# Patient Record
Sex: Female | Born: 1965
Health system: Southern US, Community
[De-identification: ages and names within clinical notes are randomized; demographics above are authoritative.]

## PROBLEM LIST (undated history)

## (undated) DIAGNOSIS — I10 Essential (primary) hypertension: Secondary | ICD-10-CM

## (undated) DIAGNOSIS — E785 Hyperlipidemia, unspecified: Secondary | ICD-10-CM

## (undated) HISTORY — DX: Hyperlipidemia, unspecified: E78.5

## (undated) HISTORY — DX: Essential (primary) hypertension: I10

---

## 2004-03-09 ENCOUNTER — Ambulatory Visit: Payer: Self-pay | Admitting: Internal Medicine

## 2010-05-12 ENCOUNTER — Ambulatory Visit: Payer: Self-pay | Admitting: Internal Medicine

## 2011-05-18 ENCOUNTER — Ambulatory Visit: Payer: Self-pay | Admitting: Internal Medicine

## 2011-06-04 ENCOUNTER — Ambulatory Visit: Payer: Self-pay | Admitting: Obstetrics and Gynecology

## 2011-07-28 ENCOUNTER — Ambulatory Visit: Payer: Self-pay | Admitting: Gastroenterology

## 2011-07-28 LAB — HM COLONOSCOPY

## 2013-01-11 IMAGING — US TRANSABDOMINAL ULTRASOUND OF PELVIS
1 series · 17 of 25 positions shown · non-contrast
Comparison: none

REASON FOR EXAM: post menopausal bleeding
COMMENTS:

[Series 1: transabdominal ultrasound of pelvis · 17 of 106 slices shown]
[im 1/106]
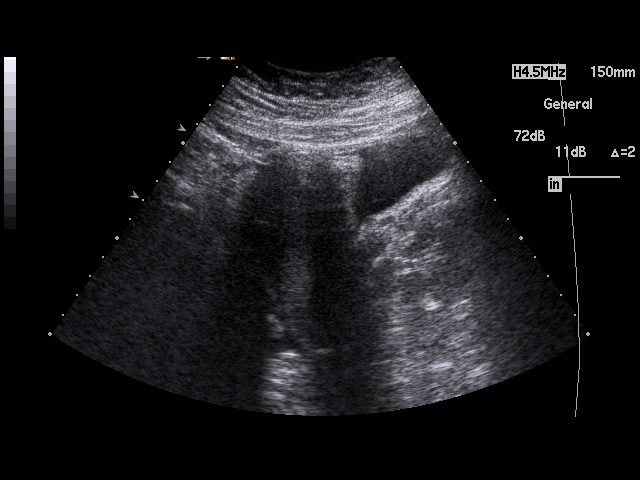
[im 9/106]
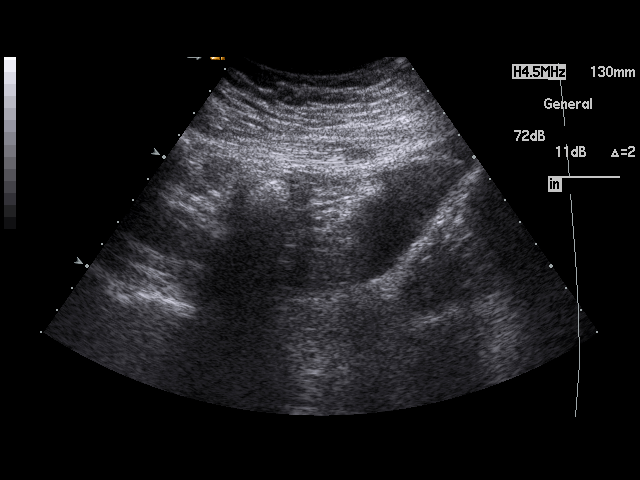
[im 14/106]
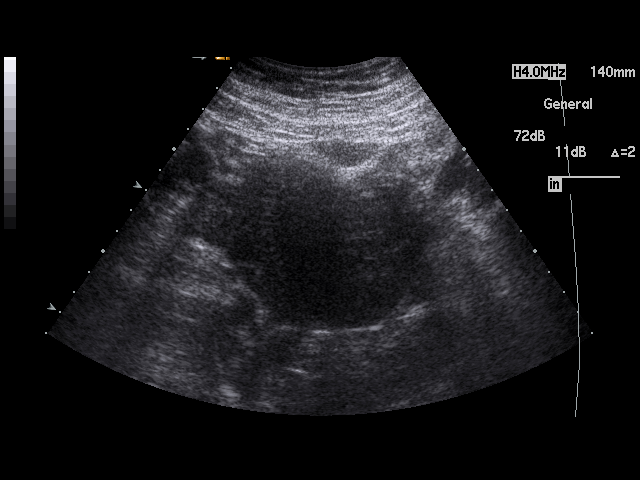
[im 22/106]
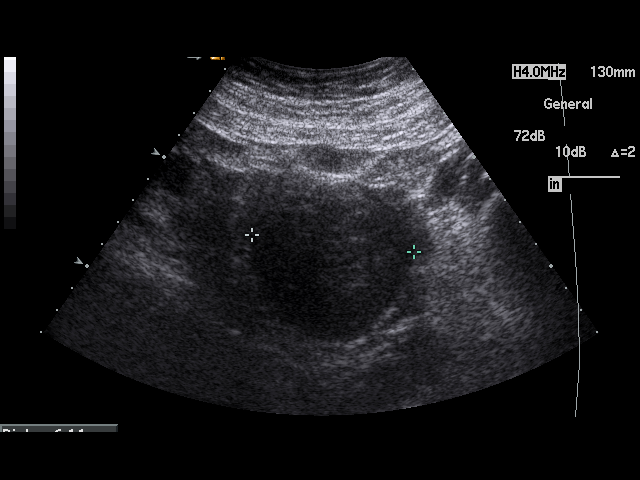
[im 27/106]
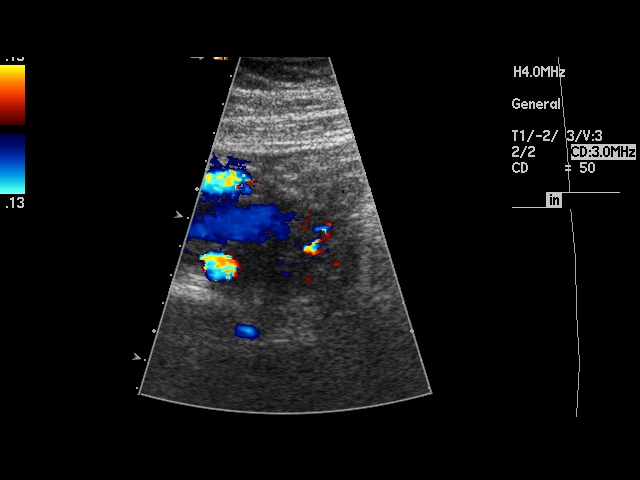
[im 36/106]
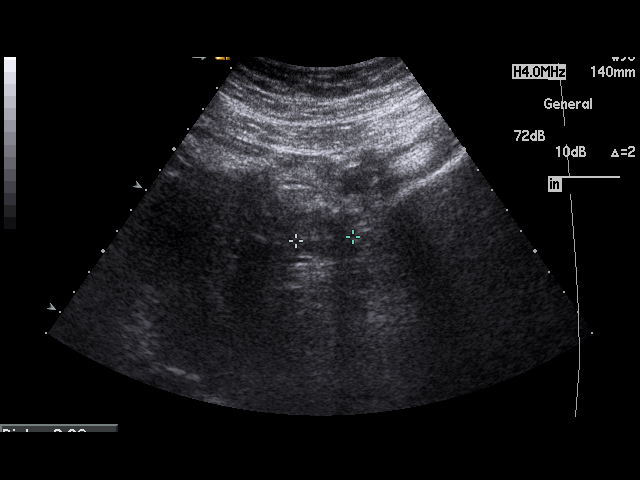
[im 40/106]
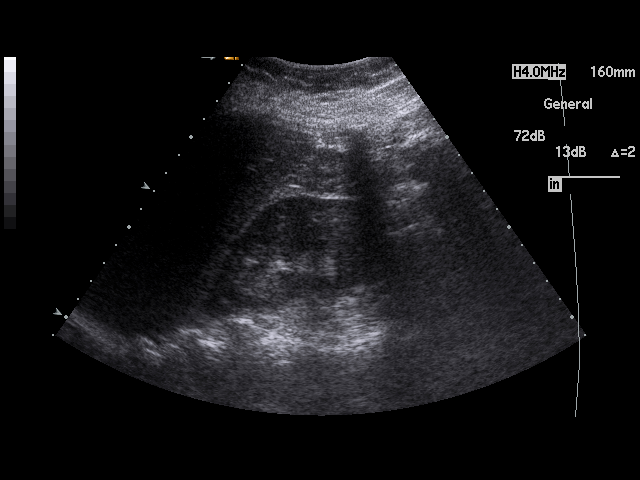
[im 49/106]
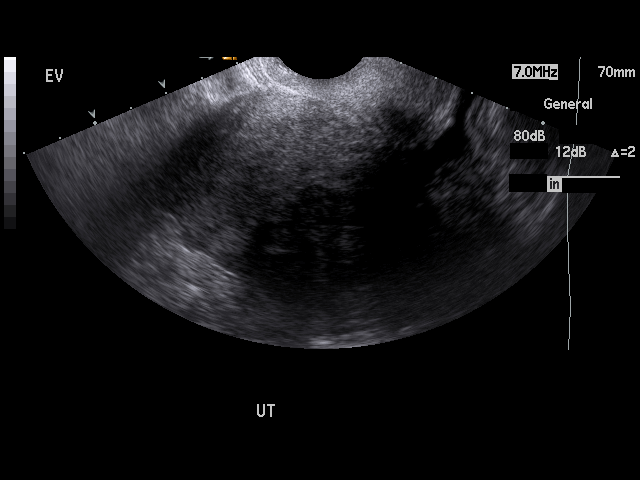
[im 53/106]
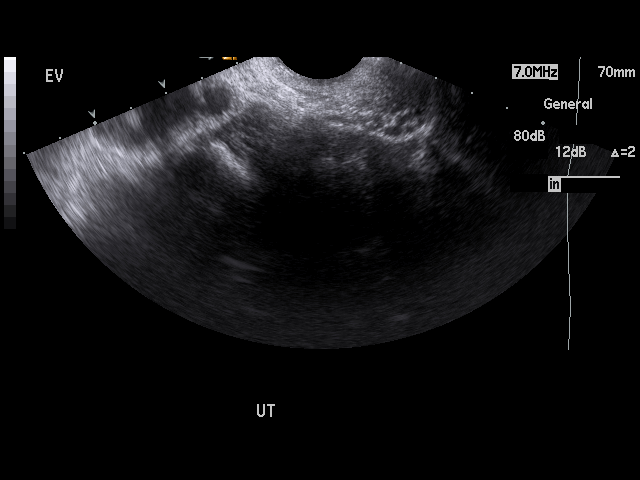
[im 57/106]
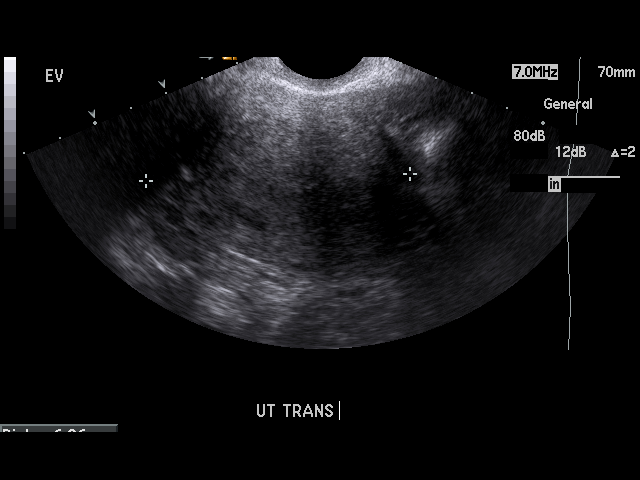
[im 66/106]
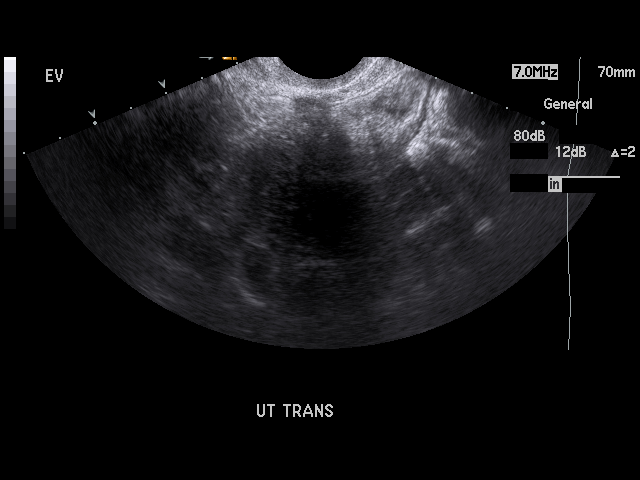
[im 71/106]
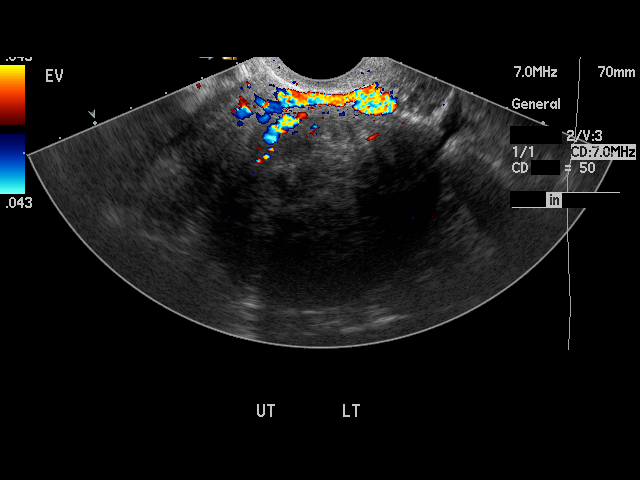
[im 79/106]
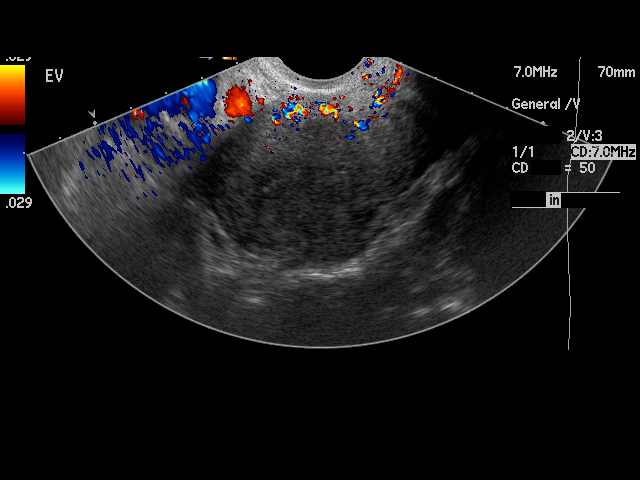
[im 84/106]
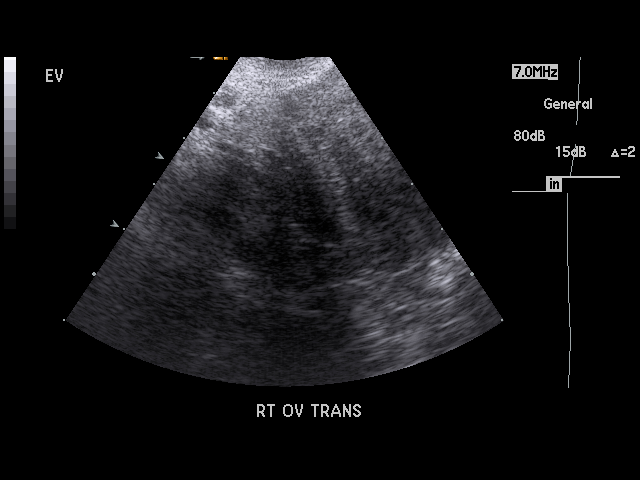
[im 92/106]
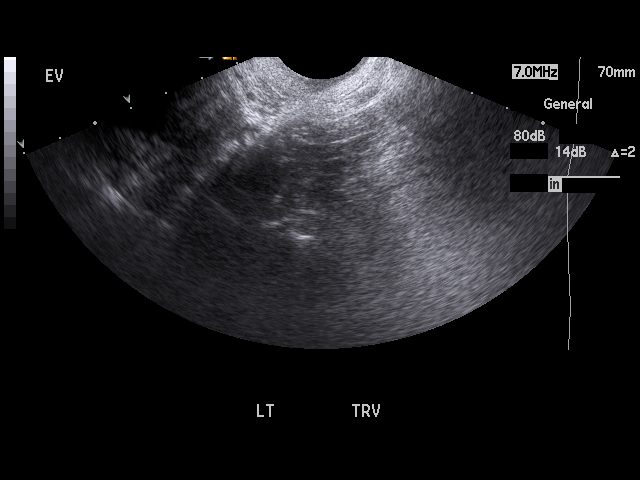
[im 97/106]
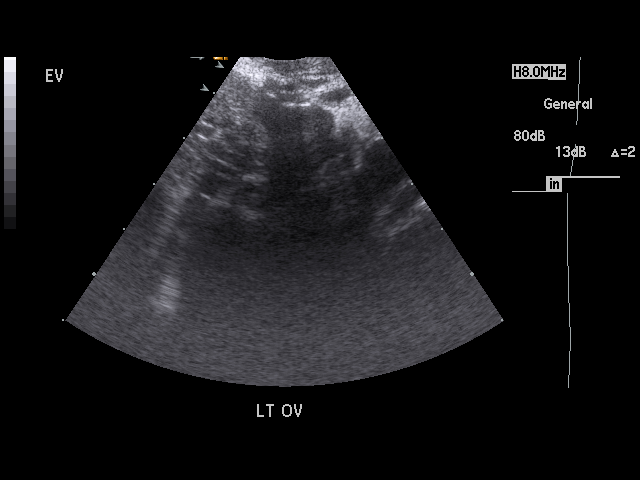
[im 106/106]
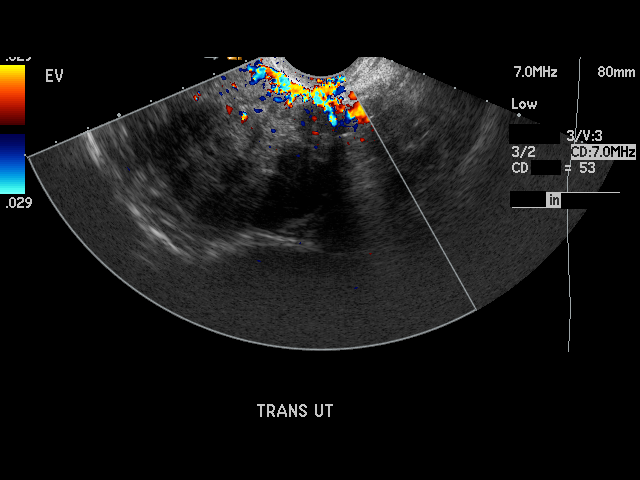

[17 of 25 positions shown; findings below may reference images not displayed]

PROCEDURE:     [facility] - [facility] PELVIS NON-OB W/TRANSVAGINAL  - June 04, 2011  [DATE]

RESULT:     Transabdominal and endovaginal ultrasound was performed. The
uterus measures 8.23 cm x 4.23 cm x 6.86 cm. There is a mass associated with
the posterior aspect of the uterus near the junction of the corpus and
fundus on the left side. The finding is compatible with a uterine fibroid
that measures 6.71 cm x 4.94 cm x 5.33 cm. No other uterine masses are seen.
The right and left ovaries are visualized and show no significant
abnormalities. The right ovary measures 2.16 cm at maximum diameter and the
left ovary measures 2.6 cm at maximum diameter. No abnormal adnexal masses
are seen. There is a nonspecific small amount of free fluid observed in the
cul-de-sac. The urinary bladder is not well filled and is poorly visualized
on this exam. The kidneys are visualized bilaterally and show no
hydronephrosis.
IMPRESSION: 1. There is a 6.71 cm mass associated with the posterior aspect of the
uterus and consistent with a uterine fibroid.
2. No additional uterine masses are seen.
3. No abnormal adnexal masses are identified.
4. There is a small amount of free fluid noted in the cul-de-sac.
5. The kidneys show no hydronephrosis.
6. The uterine endometrium measures 3.7 mm in thickness.

## 2014-02-13 ENCOUNTER — Ambulatory Visit: Payer: Self-pay | Admitting: Family Medicine

## 2015-07-29 ENCOUNTER — Other Ambulatory Visit: Payer: Self-pay | Admitting: Family Medicine

## 2015-07-29 DIAGNOSIS — Z1231 Encounter for screening mammogram for malignant neoplasm of breast: Secondary | ICD-10-CM

## 2015-08-15 ENCOUNTER — Other Ambulatory Visit: Payer: Self-pay | Admitting: Family Medicine

## 2015-08-15 ENCOUNTER — Ambulatory Visit
Admission: RE | Admit: 2015-08-15 | Discharge: 2015-08-15 | Disposition: A | Discharge status: Home / Self Care | Payer: BLUE CROSS/BLUE SHIELD | Source: Ambulatory Visit | Attending: Family Medicine | Admitting: Family Medicine

## 2015-08-15 DIAGNOSIS — Z1231 Encounter for screening mammogram for malignant neoplasm of breast: Secondary | ICD-10-CM

## 2016-06-03 ENCOUNTER — Other Ambulatory Visit: Payer: Self-pay | Admitting: Obstetrics and Gynecology

## 2016-06-03 DIAGNOSIS — Z1231 Encounter for screening mammogram for malignant neoplasm of breast: Secondary | ICD-10-CM

## 2016-08-16 ENCOUNTER — Ambulatory Visit
Admission: RE | Admit: 2016-08-16 | Discharge: 2016-08-16 | Disposition: A | Discharge status: Home / Self Care | Payer: BLUE CROSS/BLUE SHIELD | Source: Ambulatory Visit | Attending: Obstetrics and Gynecology | Admitting: Obstetrics and Gynecology

## 2016-08-16 DIAGNOSIS — Z1231 Encounter for screening mammogram for malignant neoplasm of breast: Secondary | ICD-10-CM | POA: Insufficient documentation

## 2017-04-22 DIAGNOSIS — R5383 Other fatigue: Secondary | ICD-10-CM | POA: Diagnosis not present

## 2017-04-22 DIAGNOSIS — R7989 Other specified abnormal findings of blood chemistry: Secondary | ICD-10-CM | POA: Diagnosis not present

## 2017-04-22 DIAGNOSIS — E7800 Pure hypercholesterolemia, unspecified: Secondary | ICD-10-CM | POA: Diagnosis not present

## 2017-04-22 DIAGNOSIS — Z0001 Encounter for general adult medical examination with abnormal findings: Secondary | ICD-10-CM | POA: Diagnosis not present

## 2017-04-22 DIAGNOSIS — E785 Hyperlipidemia, unspecified: Secondary | ICD-10-CM | POA: Diagnosis not present

## 2017-04-22 DIAGNOSIS — Z139 Encounter for screening, unspecified: Secondary | ICD-10-CM | POA: Diagnosis not present

## 2017-04-22 DIAGNOSIS — E559 Vitamin D deficiency, unspecified: Secondary | ICD-10-CM | POA: Diagnosis not present

## 2017-04-22 DIAGNOSIS — Z0000 Encounter for general adult medical examination without abnormal findings: Secondary | ICD-10-CM | POA: Diagnosis not present

## 2017-04-22 DIAGNOSIS — R5382 Chronic fatigue, unspecified: Secondary | ICD-10-CM | POA: Diagnosis not present

## 2017-07-19 ENCOUNTER — Other Ambulatory Visit: Payer: Self-pay | Admitting: Family Medicine

## 2017-07-19 DIAGNOSIS — Z1231 Encounter for screening mammogram for malignant neoplasm of breast: Secondary | ICD-10-CM

## 2017-08-05 ENCOUNTER — Ambulatory Visit
Admission: RE | Admit: 2017-08-05 | Discharge: 2017-08-05 | Disposition: A | Discharge status: Home / Self Care | Payer: BLUE CROSS/BLUE SHIELD | Source: Ambulatory Visit | Attending: Family Medicine | Admitting: Family Medicine

## 2017-08-05 DIAGNOSIS — Z1231 Encounter for screening mammogram for malignant neoplasm of breast: Secondary | ICD-10-CM | POA: Diagnosis not present

## 2018-04-25 ENCOUNTER — Ambulatory Visit: Payer: Self-pay | Admitting: Family Medicine

## 2018-06-05 ENCOUNTER — Other Ambulatory Visit: Payer: Self-pay

## 2018-06-05 ENCOUNTER — Ambulatory Visit: Payer: BLUE CROSS/BLUE SHIELD | Admitting: Family Medicine

## 2018-06-05 ENCOUNTER — Encounter: Payer: Self-pay | Admitting: Family Medicine

## 2018-06-05 VITALS — BP 122/76 | HR 64 | Ht 65.0 in | Wt 188.0 lb

## 2018-06-05 DIAGNOSIS — Z8639 Personal history of other endocrine, nutritional and metabolic disease: Secondary | ICD-10-CM | POA: Diagnosis not present

## 2018-06-05 DIAGNOSIS — E663 Overweight: Secondary | ICD-10-CM

## 2018-06-05 DIAGNOSIS — Z7689 Persons encountering health services in other specified circumstances: Secondary | ICD-10-CM | POA: Diagnosis not present

## 2018-06-05 NOTE — Patient Instructions (Signed)

## 2018-06-05 NOTE — Progress Notes (Signed)
Date:  06/05/2018   Name:  Mindy Yu   DOB:  August 27, 1965   MRN:  [id number]   Chief Complaint: Establish Care (in network) and Hyperlipidemia (history of hyperlipidemia- 264)  Patient is a 53 year old female who presents for an establish care exam. The patient reports the following problems: ? hyperlipidemia. Health maintenance has been reviewed up to date.  Hyperlipidemia  This is a chronic problem. The current episode started more than 1 year ago. The problem is uncontrolled. Recent lipid tests were reviewed and are high. She has no history of chronic renal disease, diabetes, hypothyroidism, liver disease, obesity or nephrotic syndrome. There are no known factors aggravating her hyperlipidemia. Pertinent negatives include no chest pain, focal sensory loss, focal weakness, leg pain, myalgias or shortness of breath. Current antihyperlipidemic treatment includes diet change. There are no compliance problems.  Risk factors for coronary artery disease include dyslipidemia.    Review of Systems  Constitutional: Negative.  Negative for chills, fatigue, fever and unexpected weight change.  HENT: Negative for congestion, ear discharge, ear pain, rhinorrhea, sinus pressure, sneezing and sore throat.   Eyes: Negative for photophobia, pain, discharge, redness and itching.  Respiratory: Negative for cough, shortness of breath, wheezing and stridor.   Cardiovascular: Negative for chest pain.  Gastrointestinal: Negative for abdominal pain, blood in stool, constipation, diarrhea, nausea and vomiting.  Endocrine: Negative for cold intolerance, heat intolerance, polydipsia, polyphagia and polyuria.  Genitourinary: Negative for dysuria, flank pain, frequency, hematuria, menstrual problem, pelvic pain, urgency, vaginal bleeding and vaginal discharge.  Musculoskeletal: Negative for arthralgias, back pain and myalgias.  Skin: Negative for rash.  Allergic/Immunologic: Negative for environmental allergies  and food allergies.  Neurological: Negative for dizziness, focal weakness, weakness, light-headedness, numbness and headaches.  Hematological: Negative for adenopathy. Does not bruise/bleed easily.  Psychiatric/Behavioral: Negative for dysphoric mood. The patient is not nervous/anxious.     There are no active problems to display for this patient.   No Known Allergies  History reviewed. No pertinent surgical history.  Social History   Tobacco Use   Smoking status: Former Smoker    Types: Cigarettes    Last attempt to quit: 02/01/2009    Years since quitting: 9.3   Smokeless tobacco: Never Used  Substance Use Topics   Alcohol use: Yes    Alcohol/week: 1.0 standard drinks    Types: 1 Glasses of wine per week    Comment: nightly   Drug use: Never     Medication list has been reviewed and updated.  No outpatient medications have been marked as taking for the 06/05/18 encounter (Office Visit) with Duanne Limerick, MD.    [facility] 2/9 Scores 06/05/2018  PHQ - 2 Score 1  PHQ- 9 Score 2    BP Readings from Last 3 Encounters:  06/05/18 122/76    Physical Exam Vitals signs and nursing note reviewed.  Constitutional:      General: She is not in acute distress.    Appearance: She is not diaphoretic.  HENT:     Head: Normocephalic and atraumatic.     Right Ear: Tympanic membrane, ear canal and external ear normal.     Left Ear: Tympanic membrane, ear canal and external ear normal.     Nose: Nose normal. No congestion or rhinorrhea.     Mouth/Throat:     Mouth: Mucous membranes are moist.  Eyes:     General:        Right eye: No discharge.  Left eye: No discharge.     Conjunctiva/sclera: Conjunctivae normal.     Pupils: Pupils are equal, round, and reactive to light.  Neck:     Musculoskeletal: Normal range of motion and neck supple.     Thyroid: No thyromegaly.     Vascular: No JVD.  Cardiovascular:     Rate and Rhythm: Regular rhythm. Bradycardia present.      Pulses: Normal pulses.     Heart sounds: Normal heart sounds. No murmur. No friction rub. No gallop.   Pulmonary:     Effort: Pulmonary effort is normal.     Breath sounds: Normal breath sounds. No wheezing or rhonchi.  Abdominal:     General: Bowel sounds are normal.     Palpations: Abdomen is soft. There is no mass.     Tenderness: There is no abdominal tenderness. There is no guarding or rebound.  Musculoskeletal: Normal range of motion.  Lymphadenopathy:     Cervical: No cervical adenopathy.  Skin:    General: Skin is warm and dry.     Capillary Refill: Capillary refill takes less than 2 seconds.  Neurological:     Mental Status: She is alert.     Deep Tendon Reflexes: Reflexes are normal and symmetric.     Wt Readings from Last 3 Encounters:  06/05/18 188 lb (85.3 kg)    BP 122/76    Pulse 64    Ht 5\' 5"  (1.651 m)    Wt 188 lb (85.3 kg)    BMI 31.28 kg/m   Assessment and Plan: 1. Establishing care with new doctor, encounter for Patient establishes care with new physician today Patient's previous records were not available for review.  Patient's history was only thing that we could review and there was no new formation. 2. Overweight for height And is overweight for height and loss diet was given. - Renal Function Panel  3. History of hyperlipidemia Patient has a history of hyperlipidemia for which she would not like to be on a medication as previously suggested.  Will check lipid panel and renal panel for evaluation. - Lipid panel - Renal Function Panel Immunizations are reviewed and recommendations provided.   Age appropriate screening tests are discussed. Counseling given for risk factor reduction interventions.  Immunizations was reviewed screening by Pap smear was reviewed with patient.  Patient apparently has a history of an abnormal Pap that we will have her sign for release of information so I can review past test to see if we need to refer to gynecology.

## 2018-06-06 LAB — LIPID PANEL
Chol/HDL Ratio: 2.5 ratio (ref 0.0–4.4)
Cholesterol, Total: 220 mg/dL — ABNORMAL HIGH (ref 100–199)
HDL: 89 mg/dL (ref >39)
LDL Calculated: 122 mg/dL — ABNORMAL HIGH (ref 0–99)
Triglycerides: 44 mg/dL (ref 0–149)
VLDL Cholesterol Cal: 9 mg/dL (ref 5–40)

## 2018-06-06 LAB — RENAL FUNCTION PANEL
Albumin: 4.7 g/dL (ref 3.8–4.9)
BUN/Creatinine Ratio: 13 (ref 9–23)
BUN: 10 mg/dL (ref 6–24)
CO2: 23 mmol/L (ref 20–29)
Calcium: 9.7 mg/dL (ref 8.7–10.2)
Chloride: 102 mmol/L (ref 96–106)
Creatinine, Ser: 0.78 mg/dL (ref 0.57–1.00)
GFR calc Af Amer: 100 mL/min/{1.73_m2} (ref >59)
GFR calc non Af Amer: 86 mL/min/{1.73_m2} (ref >59)
Glucose: 80 mg/dL (ref 65–99)
Phosphorus: 4.4 mg/dL — ABNORMAL HIGH (ref 3.0–4.3)
Potassium: 4.3 mmol/L (ref 3.5–5.2)
Sodium: 140 mmol/L (ref 134–144)

## 2018-07-10 ENCOUNTER — Other Ambulatory Visit: Payer: Self-pay | Admitting: Family Medicine

## 2018-07-10 DIAGNOSIS — Z1231 Encounter for screening mammogram for malignant neoplasm of breast: Secondary | ICD-10-CM

## 2018-08-17 ENCOUNTER — Other Ambulatory Visit: Payer: Self-pay

## 2018-08-17 ENCOUNTER — Ambulatory Visit
Admission: RE | Admit: 2018-08-17 | Discharge: 2018-08-17 | Disposition: A | Discharge status: Home / Self Care | Payer: BC Managed Care – PPO | Source: Ambulatory Visit | Attending: Family Medicine | Admitting: Family Medicine

## 2018-08-17 DIAGNOSIS — Z1231 Encounter for screening mammogram for malignant neoplasm of breast: Secondary | ICD-10-CM | POA: Diagnosis not present

## 2018-12-06 ENCOUNTER — Encounter: Payer: Self-pay | Admitting: Family Medicine

## 2018-12-06 ENCOUNTER — Ambulatory Visit: Payer: BC Managed Care – PPO | Admitting: Family Medicine

## 2018-12-06 ENCOUNTER — Other Ambulatory Visit: Payer: Self-pay

## 2018-12-06 VITALS — BP 120/80 | HR 60 | Ht 65.0 in | Wt 196.0 lb

## 2018-12-06 DIAGNOSIS — E663 Overweight: Secondary | ICD-10-CM | POA: Diagnosis not present

## 2018-12-06 DIAGNOSIS — Z8639 Personal history of other endocrine, nutritional and metabolic disease: Secondary | ICD-10-CM

## 2018-12-06 NOTE — Patient Instructions (Signed)

## 2018-12-06 NOTE — Progress Notes (Signed)
Date:  12/06/2018   Name:  Mindy Yu   DOB:  August 17, 1965   MRN:  [id number]   Chief Complaint: follow up visit (lipid recheck)  Hyperlipidemia This is a new problem. She has no history of chronic renal disease, diabetes, hypothyroidism, liver disease, obesity or nephrotic syndrome. Exacerbated by: stress about covid. Pertinent negatives include no chest pain, focal sensory loss, focal weakness, leg pain, myalgias or shortness of breath. Current antihyperlipidemic treatment includes diet change.    Review of Systems  Constitutional: Negative.  Negative for chills, fatigue, fever and unexpected weight change.  HENT: Negative for congestion, ear discharge, ear pain, rhinorrhea, sinus pressure, sneezing and sore throat.   Eyes: Negative for photophobia, pain, discharge, redness and itching.  Respiratory: Negative for cough, shortness of breath, wheezing and stridor.   Cardiovascular: Negative for chest pain.  Gastrointestinal: Negative for abdominal pain, blood in stool, constipation, diarrhea, nausea and vomiting.  Endocrine: Negative for cold intolerance, heat intolerance, polydipsia, polyphagia and polyuria.  Genitourinary: Negative for dysuria, flank pain, frequency, hematuria, menstrual problem, pelvic pain, urgency, vaginal bleeding and vaginal discharge.  Musculoskeletal: Negative for arthralgias, back pain and myalgias.  Skin: Negative for rash.  Allergic/Immunologic: Negative for environmental allergies and food allergies.  Neurological: Negative for dizziness, focal weakness, weakness, light-headedness, numbness and headaches.  Hematological: Negative for adenopathy. Does not bruise/bleed easily.  Psychiatric/Behavioral: Negative for dysphoric mood. The patient is not nervous/anxious.     There are no active problems to display for this patient.   No Known Allergies  History reviewed. No pertinent surgical history.  Social History   Tobacco Use  . Smoking status:  Former Smoker    Types: Cigarettes    Quit date: 02/01/2009    Years since quitting: 9.8  . Smokeless tobacco: Never Used  Substance Use Topics  . Alcohol use: Yes    Alcohol/week: 1.0 standard drinks    Types: 1 Glasses of wine per week    Comment: nightly  . Drug use: Never     Medication list has been reviewed and updated.  No outpatient medications have been marked as taking for the 12/06/18 encounter (Office Visit) with Duanne Limerick, MD.    [facility] 2/9 Scores 12/06/2018 06/05/2018  PHQ - 2 Score 1 1  PHQ- 9 Score 2 2    BP Readings from Last 3 Encounters:  12/06/18 120/80  06/05/18 122/76    Physical Exam Vitals signs and nursing note reviewed.  Constitutional:      Appearance: She is well-developed.  HENT:     Head: Normocephalic.     Right Ear: Tympanic membrane, ear canal and external ear normal.     Left Ear: Tympanic membrane, ear canal and external ear normal.  Eyes:     General: Lids are everted, no foreign bodies appreciated. No scleral icterus.       Left eye: No foreign body or hordeolum.     Conjunctiva/sclera: Conjunctivae normal.     Right eye: Right conjunctiva is not injected.     Left eye: Left conjunctiva is not injected.     Pupils: Pupils are equal, round, and reactive to light.  Neck:     Musculoskeletal: Normal range of motion and neck supple.     Thyroid: No thyromegaly.     Vascular: No JVD.     Trachea: No tracheal deviation.  Cardiovascular:     Rate and Rhythm: Normal rate and regular rhythm.     Chest Wall:  PMI is not displaced.     Heart sounds: Normal heart sounds, S1 normal and S2 normal. No murmur. No systolic murmur. No diastolic murmur. No friction rub. No gallop. No S3 or S4 sounds.   Pulmonary:     Effort: Pulmonary effort is normal. No respiratory distress.     Breath sounds: Normal breath sounds. No decreased breath sounds, wheezing or rales.  Abdominal:     General: Bowel sounds are normal.     Palpations: Abdomen is soft.  There is no hepatomegaly, splenomegaly or mass.     Tenderness: There is no abdominal tenderness. There is no guarding or rebound.  Musculoskeletal: Normal range of motion.        General: No tenderness.  Lymphadenopathy:     Cervical: No cervical adenopathy.  Skin:    General: Skin is warm.     Findings: No rash.  Neurological:     Mental Status: She is alert and oriented to person, place, and time.     Cranial Nerves: No cranial nerve deficit.     Deep Tendon Reflexes: Reflexes normal.  Psychiatric:        Mood and Affect: Mood is not anxious or depressed.     Wt Readings from Last 3 Encounters:  12/06/18 196 lb (88.9 kg)  06/05/18 188 lb (85.3 kg)    BP 120/80   Pulse 60   Ht 5\' 5"  (1.651 m)   Wt 196 lb (88.9 kg)   BMI 32.62 kg/m   Assessment and Plan: 1. History of hyperlipidemia Patient was noted to have a LDL in the 1 23-1 29 range.  We discussed her at risk and goal of reaching below 100.  Patient is going to continue her diet.  We will check a lipid panel with ratio.  Was noted that her HDL is very good and that the ratio is very good at 2.5 - Lipid Panel With LDL/HDL Ratio  2. Overweight for height Patient has actually gained some weight which she admits to stress eating.  We have given her Mediterranean diet and encouraged to follow these guidelines. - Lipid Panel With LDL/HDL Ratio

## 2018-12-07 LAB — LIPID PANEL WITH LDL/HDL RATIO
Cholesterol, Total: 238 mg/dL — ABNORMAL HIGH (ref 100–199)
HDL: 97 mg/dL (ref >39)
LDL Chol Calc (NIH): 133 mg/dL — ABNORMAL HIGH (ref 0–99)
LDL/HDL Ratio: 1.4 ratio (ref 0.0–3.2)
Triglycerides: 48 mg/dL (ref 0–149)
VLDL Cholesterol Cal: 8 mg/dL (ref 5–40)

## 2019-08-10 ENCOUNTER — Other Ambulatory Visit: Payer: Self-pay | Admitting: Family Medicine

## 2019-08-10 DIAGNOSIS — Z1231 Encounter for screening mammogram for malignant neoplasm of breast: Secondary | ICD-10-CM

## 2019-08-24 ENCOUNTER — Ambulatory Visit
Admission: RE | Admit: 2019-08-24 | Discharge: 2019-08-24 | Disposition: A | Discharge status: Home / Self Care | Payer: BC Managed Care – PPO | Source: Ambulatory Visit | Attending: Family Medicine | Admitting: Family Medicine

## 2019-08-24 DIAGNOSIS — Z1231 Encounter for screening mammogram for malignant neoplasm of breast: Secondary | ICD-10-CM | POA: Diagnosis not present

## 2019-09-06 ENCOUNTER — Other Ambulatory Visit: Payer: Self-pay

## 2019-09-06 ENCOUNTER — Ambulatory Visit: Payer: BC Managed Care – PPO | Admitting: Family Medicine

## 2019-09-06 ENCOUNTER — Encounter: Payer: Self-pay | Admitting: Family Medicine

## 2019-09-06 VITALS — BP 130/88 | HR 84 | Ht 65.0 in | Wt 186.0 lb

## 2019-09-06 DIAGNOSIS — E782 Mixed hyperlipidemia: Secondary | ICD-10-CM

## 2019-09-06 NOTE — Progress Notes (Signed)
Date:  09/06/2019   Name:  Mindy Yu   DOB:  1965-05-03   MRN:  [id number]   Chief Complaint: Hyperlipidemia (follow up )  Hyperlipidemia This is a chronic problem. The current episode started more than 1 year ago. The problem is controlled. Recent lipid tests were reviewed and are normal. She has no history of chronic renal disease, diabetes, hypothyroidism, liver disease, obesity or nephrotic syndrome. There are no known factors aggravating her hyperlipidemia. Pertinent negatives include no chest pain, focal sensory loss, focal weakness, leg pain, myalgias or shortness of breath. Current antihyperlipidemic treatment includes diet change. The current treatment provides moderate improvement of lipids. There are no compliance problems.     Lab Results  Component Value Date   CREATININE 0.78 06/05/2018   BUN 10 06/05/2018   NA 140 06/05/2018   K 4.3 06/05/2018   CL 102 06/05/2018   CO2 23 06/05/2018   Lab Results  Component Value Date   CHOL 238 (H) 12/06/2018   HDL 97 12/06/2018   LDLCALC 133 (H) 12/06/2018   TRIG 48 12/06/2018   CHOLHDL 2.5 06/05/2018   No results found for: TSH No results found for: HGBA1C No results found for: WBC, HGB, HCT, MCV, PLT No results found for: ALT, AST, GGT, ALKPHOS, BILITOT   Review of Systems  Constitutional: Negative.  Negative for chills, fatigue, fever and unexpected weight change.  HENT: Negative for congestion, ear discharge, ear pain, rhinorrhea, sinus pressure, sneezing and sore throat.   Eyes: Negative for photophobia, pain, discharge, redness and itching.  Respiratory: Negative for cough, shortness of breath, wheezing and stridor.   Cardiovascular: Negative for chest pain.  Gastrointestinal: Negative for abdominal pain, blood in stool, constipation, diarrhea, nausea and vomiting.  Endocrine: Negative for cold intolerance, heat intolerance, polydipsia, polyphagia and polyuria.  Genitourinary: Negative for dysuria, flank  pain, frequency, hematuria, menstrual problem, pelvic pain, urgency, vaginal bleeding and vaginal discharge.  Musculoskeletal: Negative for arthralgias, back pain and myalgias.  Skin: Negative for rash.  Allergic/Immunologic: Negative for environmental allergies and food allergies.  Neurological: Negative for dizziness, focal weakness, weakness, light-headedness, numbness and headaches.  Hematological: Negative for adenopathy. Does not bruise/bleed easily.  Psychiatric/Behavioral: Negative for dysphoric mood. The patient is not nervous/anxious.     There are no problems to display for this patient.   No Known Allergies  History reviewed. No pertinent surgical history.  Social History   Tobacco Use  . Smoking status: Former Smoker    Types: Cigarettes    Quit date: 02/01/2009    Years since quitting: 10.6  . Smokeless tobacco: Never Used  Substance Use Topics  . Alcohol use: Yes    Alcohol/week: 1.0 standard drink    Types: 1 Glasses of wine per week    Comment: nightly  . Drug use: Never     Medication list has been reviewed and updated.  No outpatient medications have been marked as taking for the 09/06/19 encounter (Office Visit) with Duanne Limerick, MD.    [facility] 2/9 Scores 09/06/2019 12/06/2018 06/05/2018  PHQ - 2 Score 0 1 1  PHQ- 9 Score 0 2 2    GAD 7 : Generalized Anxiety Score 09/06/2019 12/06/2018  Nervous, Anxious, on Edge 0 0  Control/stop worrying 0 0  Worry too much - different things 0 0  Trouble relaxing 0 0  Restless 0 0  Easily annoyed or irritable 0 0  Afraid - awful might happen 0 0  Total GAD  7 Score 0 0  Anxiety Difficulty Not difficult at all -    BP Readings from Last 3 Encounters:  09/06/19 130/88  12/06/18 120/80  06/05/18 122/76    Physical Exam Vitals and nursing note reviewed.  Constitutional:      General: She is not in acute distress.    Appearance: She is not diaphoretic.  HENT:     Head: Normocephalic and atraumatic.     Right  Ear: Tympanic membrane, ear canal and external ear normal.     Left Ear: Tympanic membrane, ear canal and external ear normal.     Nose: Nose normal. No congestion or rhinorrhea.     Mouth/Throat:     Mouth: Mucous membranes are moist.  Eyes:     General:        Right eye: No discharge.        Left eye: No discharge.     Conjunctiva/sclera: Conjunctivae normal.     Pupils: Pupils are equal, round, and reactive to light.  Neck:     Thyroid: No thyromegaly.     Vascular: No JVD.  Cardiovascular:     Rate and Rhythm: Normal rate and regular rhythm.     Heart sounds: Normal heart sounds. No murmur heard.  No friction rub. No gallop.   Pulmonary:     Effort: Pulmonary effort is normal.     Breath sounds: Normal breath sounds. No wheezing or rhonchi.  Abdominal:     General: Bowel sounds are normal.     Palpations: Abdomen is soft. There is no mass.     Tenderness: There is no abdominal tenderness. There is no guarding.  Musculoskeletal:        General: Normal range of motion.     Cervical back: Normal range of motion and neck supple.  Lymphadenopathy:     Cervical: No cervical adenopathy.  Skin:    General: Skin is warm and dry.     Capillary Refill: Capillary refill takes less than 2 seconds.  Neurological:     Mental Status: She is alert.     Motor: No weakness.     Deep Tendon Reflexes: Reflexes are normal and symmetric.     Wt Readings from Last 3 Encounters:  09/06/19 186 lb (84.4 kg)  12/06/18 196 lb (88.9 kg)  06/05/18 188 lb (85.3 kg)    BP 130/88   Pulse 84   Ht 5\' 5"  (1.651 m)   Wt 186 lb (84.4 kg)   SpO2 99%   BMI 30.95 kg/m   Assessment and Plan: 1. Moderate mixed hyperlipidemia not requiring statin therapy Chronic.  Stable.  Significantly improved with 10 pound weight loss.  We will check patient's lipid panel for LDL status and hopefully this will be ending reduced range coupled with her excellent HDL level.  Patient has been encouraged with her weight  loss and continuance.  And we will recheck patient on an as-needed basis. - Lipid Panel With LDL/HDL Ratio

## 2019-09-07 ENCOUNTER — Telehealth: Payer: Self-pay | Admitting: Family Medicine

## 2019-09-07 LAB — LIPID PANEL WITH LDL/HDL RATIO
Cholesterol, Total: 211 mg/dL — ABNORMAL HIGH (ref 100–199)
HDL: 76 mg/dL (ref >39)
LDL Chol Calc (NIH): 128 mg/dL — ABNORMAL HIGH (ref 0–99)
LDL/HDL Ratio: 1.7 ratio (ref 0.0–3.2)
Triglycerides: 41 mg/dL (ref 0–149)
VLDL Cholesterol Cal: 7 mg/dL (ref 5–40)

## 2019-09-07 NOTE — Telephone Encounter (Signed)
Patient is calling back to find out her lab results for her Contains abnormal dataLipid Panel With LDL/HDL Ratio . Patient states she does not have access to MyChart. And denied wanting access to MyChart. Please advise CB- [phone]

## 2019-09-10 NOTE — Telephone Encounter (Signed)
Spoke to pt concerning lab numbers

## 2020-04-17 ENCOUNTER — Other Ambulatory Visit (HOSPITAL_COMMUNITY)
Admission: RE | Admit: 2020-04-17 | Discharge: 2020-04-17 | Disposition: A | Discharge status: Home / Self Care | Payer: BC Managed Care – PPO | Source: Ambulatory Visit | Attending: Family Medicine | Admitting: Family Medicine

## 2020-04-17 ENCOUNTER — Ambulatory Visit (INDEPENDENT_AMBULATORY_CARE_PROVIDER_SITE_OTHER): Payer: BC Managed Care – PPO | Admitting: Family Medicine

## 2020-04-17 ENCOUNTER — Other Ambulatory Visit: Payer: Self-pay

## 2020-04-17 ENCOUNTER — Encounter: Payer: Self-pay | Admitting: Family Medicine

## 2020-04-17 VITALS — BP 130/84 | HR 64 | Ht 65.0 in | Wt 179.0 lb

## 2020-04-17 DIAGNOSIS — Z0000 Encounter for general adult medical examination without abnormal findings: Secondary | ICD-10-CM

## 2020-04-17 DIAGNOSIS — E782 Mixed hyperlipidemia: Secondary | ICD-10-CM

## 2020-04-17 DIAGNOSIS — Z124 Encounter for screening for malignant neoplasm of cervix: Secondary | ICD-10-CM | POA: Diagnosis not present

## 2020-04-17 DIAGNOSIS — Z1211 Encounter for screening for malignant neoplasm of colon: Secondary | ICD-10-CM | POA: Diagnosis not present

## 2020-04-17 LAB — HEMOCCULT GUIAC POC 1CARD (OFFICE): Fecal Occult Blood, POC: NEGATIVE

## 2020-04-17 NOTE — Progress Notes (Addendum)
Date:  04/17/2020   Name:  Mindy Yu   DOB:  [birth year]   MRN:  [id number]   Chief Complaint: Annual Exam  Patient is a 55 year old female who presents for a comprehensive physical exam. The patient reports the following problems: none. Health maintenance has been reviewed up to date.   Lab Results  Component Value Date   CREATININE 0.78 06/05/2018   BUN 10 06/05/2018   NA 140 06/05/2018   K 4.3 06/05/2018   CL 102 06/05/2018   CO2 23 06/05/2018   Lab Results  Component Value Date   CHOL 211 (H) 09/06/2019   HDL 76 09/06/2019   LDLCALC 128 (H) 09/06/2019   TRIG 41 09/06/2019   CHOLHDL 2.5 06/05/2018   No results found for: TSH No results found for: HGBA1C No results found for: WBC, HGB, HCT, MCV, PLT No results found for: ALT, AST, GGT, ALKPHOS, BILITOT   Review of Systems  Constitutional: Negative.  Negative for chills, fatigue, fever and unexpected weight change.  HENT: Negative for congestion, ear discharge, ear pain, rhinorrhea, sinus pressure, sneezing and sore throat.   Eyes: Negative for photophobia, pain, discharge, redness and itching.  Respiratory: Negative for cough, shortness of breath, wheezing and stridor.   Gastrointestinal: Negative for abdominal pain, blood in stool, constipation, diarrhea, nausea and vomiting.  Endocrine: Negative for cold intolerance, heat intolerance, polydipsia, polyphagia and polyuria.  Genitourinary: Negative for dysuria, flank pain, frequency, hematuria, menstrual problem, pelvic pain, urgency, vaginal bleeding and vaginal discharge.  Musculoskeletal: Negative for arthralgias, back pain and myalgias.  Skin: Negative for rash.  Allergic/Immunologic: Negative for environmental allergies and food allergies.  Neurological: Negative for dizziness, weakness, light-headedness, numbness and headaches.  Hematological: Negative for adenopathy. Does not bruise/bleed easily.  Psychiatric/Behavioral: Negative for dysphoric mood. The  patient is not nervous/anxious.     There are no problems to display for this patient.   No Known Allergies  No past surgical history on file.  Social History   Tobacco Use  . Smoking status: Former Smoker    Types: Cigarettes    Quit date: 02/01/2009    Years since quitting: 11.2  . Smokeless tobacco: Never Used  Substance Use Topics  . Alcohol use: Yes    Alcohol/week: 1.0 standard drink    Types: 1 Glasses of wine per week    Comment: nightly  . Drug use: Never     Medication list has been reviewed and updated.  No outpatient medications have been marked as taking for the 04/17/20 encounter (Office Visit) with Duanne Limerick, MD.    [facility] 2/9 Scores 04/17/2020 09/06/2019 12/06/2018 06/05/2018  PHQ - 2 Score 0 0 1 1  PHQ- 9 Score 0 0 2 2    GAD 7 : Generalized Anxiety Score 04/17/2020 09/06/2019 12/06/2018  Nervous, Anxious, on Edge 0 0 0  Control/stop worrying 0 0 0  Worry too much - different things 0 0 0  Trouble relaxing 0 0 0  Restless 0 0 0  Easily annoyed or irritable 0 0 0  Afraid - awful might happen 0 0 0  Total GAD 7 Score 0 0 0  Anxiety Difficulty - Not difficult at all -    BP Readings from Last 3 Encounters:  04/17/20 130/84  09/06/19 130/88  12/06/18 120/80    Physical Exam Vitals and nursing note reviewed. Exam conducted with a chaperone present.  Constitutional:      Appearance: She is well-developed and  normal weight.  HENT:     Head: Normocephalic.     Jaw: There is normal jaw occlusion.     Right Ear: Hearing, tympanic membrane, ear canal and external ear normal.     Left Ear: Hearing, tympanic membrane, ear canal and external ear normal.     Nose: Nose normal.     Mouth/Throat:     Lips: Pink.     Mouth: Mucous membranes are moist.     Dentition: Normal dentition.     Tongue: No lesions.     Palate: No mass and lesions.     Pharynx: Oropharynx is clear. Uvula midline.     Tonsils: No tonsillar exudate or tonsillar abscesses.  Eyes:      General: Lids are normal. Lids are everted, no foreign bodies appreciated. Vision grossly intact. No scleral icterus.       Left eye: No foreign body or hordeolum.     Extraocular Movements: Extraocular movements intact.     Conjunctiva/sclera: Conjunctivae normal.     Right eye: Right conjunctiva is not injected.     Left eye: Left conjunctiva is not injected.     Pupils: Pupils are equal, round, and reactive to light.     Funduscopic exam:    Right eye: Red reflex present.        Left eye: Red reflex present. Neck:     Thyroid: No thyroid mass, thyromegaly or thyroid tenderness.     Vascular: Normal carotid pulses. No carotid bruit, hepatojugular reflux or JVD.     Trachea: Trachea normal. No tracheal deviation.  Cardiovascular:     Rate and Rhythm: Normal rate and regular rhythm.     Chest Wall: PMI is not displaced.     Pulses: Normal pulses.          Carotid pulses are 2+ on the right side and 2+ on the left side.      Radial pulses are 2+ on the right side and 2+ on the left side.       Femoral pulses are 2+ on the right side and 2+ on the left side.      Popliteal pulses are 2+ on the right side and 2+ on the left side.       Dorsalis pedis pulses are 2+ on the right side and 2+ on the left side.       Posterior tibial pulses are 2+ on the right side and 2+ on the left side.     Heart sounds: Normal heart sounds, S1 normal and S2 normal. No murmur heard.  No systolic murmur is present.  No diastolic murmur is present. No friction rub. No gallop. No S3 sounds.   Pulmonary:     Effort: Pulmonary effort is normal. No respiratory distress.     Breath sounds: Normal breath sounds. No decreased air movement or transmitted upper airway sounds. No decreased breath sounds, wheezing, rhonchi or rales.  Chest:  Breasts: Breasts are symmetrical.     Right: Normal. No swelling, bleeding, inverted nipple, mass, nipple discharge, skin change, tenderness, axillary adenopathy or  supraclavicular adenopathy.     Left: Normal. No swelling, bleeding, inverted nipple, mass, nipple discharge, skin change, tenderness, axillary adenopathy or supraclavicular adenopathy.    Abdominal:     General: Bowel sounds are normal.     Palpations: Abdomen is soft. There is no hepatomegaly, splenomegaly or mass.     Tenderness: There is no abdominal tenderness. There is no guarding or  rebound.     Hernia: No hernia is present. There is no hernia in the left inguinal area or right inguinal area.  Genitourinary:    General: Normal vulva.     Exam position: Lithotomy position.     Labia:        Right: No rash, tenderness or lesion.        Left: No rash, tenderness or lesion.      Urethra: No prolapse.     Vagina: Normal.     Cervix: Normal.     Uterus: Normal.      Adnexa: Right adnexa normal and left adnexa normal.     Rectum: Guaiac result negative. External hemorrhoid present. No mass.  Musculoskeletal:        General: No tenderness. Normal range of motion.     Cervical back: Full passive range of motion without pain, normal range of motion and neck supple.     Right lower leg: No edema.     Left lower leg: No edema.  Lymphadenopathy:     Head:     Right side of head: No submental, submandibular or tonsillar adenopathy.     Left side of head: No submental, submandibular or tonsillar adenopathy.     Cervical: No cervical adenopathy.     Right cervical: No superficial, deep or posterior cervical adenopathy.    Left cervical: No superficial or deep cervical adenopathy.     Upper Body:     Right upper body: No supraclavicular or axillary adenopathy.     Left upper body: No supraclavicular or axillary adenopathy.     Lower Body: No right inguinal adenopathy. No left inguinal adenopathy.  Skin:    General: Skin is warm.     Capillary Refill: Capillary refill takes less than 2 seconds.     Findings: No rash.  Neurological:     Mental Status: She is alert and oriented to  person, place, and time.     Cranial Nerves: Cranial nerves are intact. No cranial nerve deficit.     Sensory: Sensation is intact.     Motor: Motor function is intact.     Deep Tendon Reflexes: Reflexes are normal and symmetric. Reflexes normal.     Reflex Scores:      Tricep reflexes are 2+ on the right side and 2+ on the left side.      Bicep reflexes are 2+ on the right side and 2+ on the left side.      Brachioradialis reflexes are 2+ on the right side and 2+ on the left side.      Patellar reflexes are 2+ on the right side and 2+ on the left side.      Achilles reflexes are 2+ on the right side and 2+ on the left side. Psychiatric:        Mood and Affect: Mood is not anxious or depressed.     Wt Readings from Last 3 Encounters:  04/17/20 179 lb (81.2 kg)  09/06/19 186 lb (84.4 kg)  12/06/18 196 lb (88.9 kg)    BP 130/84   Pulse 64   Ht 5\' 5"  (1.651 m)   Wt 179 lb (81.2 kg)   BMI 29.79 kg/m   Assessment and Plan:  1. Annual physical exam No subjective/objective concerns noted during history and physical exam.  Patient's chart was reviewed for previous encounters most recent labs most recent imaging and care everywhere.Mindy Yu is a 55 y.o. female who presents today  for her Complete Annual Exam. She feels well. She reports exercising occasionally.. She reports she is sleeping well. Immunizations are reviewed and recommendations provided.   Age appropriate screening tests are discussed. Counseling given for risk factor reduction interventions.  We will obtain a lipid panel renal panel at this time. - Lipid Panel With LDL/HDL Ratio - Renal Function Panel  2. Colon cancer screening Chronic.  Controlled.  Stable.  Upon calling patient had previous colonoscopy with Dr. Val Eagle and apparently was to have had repeat colonoscopy in 5 years.  Patient is being referred for colonoscopy per protocol.  Point-of-care occult was noted to be negative. - Ambulatory referral to  Gastroenterology - POCT Occult Blood Stool  3. Pap smear for cervical cancer screening Patient has not had abnormal Pap smears in the past and we will obtain a Pap smear and HPV evaluation at this time. - Cytology - PAP  4. Mixed hyperlipidemia Chronic.  Controlled.  Stable.  Patient is currently taking control with dietary discretion.  We will obtain a lipid panel for current status of control. - Lipid Panel With LDL/HDL Ratio - Renal Function Panel

## 2020-04-18 LAB — LIPID PANEL WITH LDL/HDL RATIO
Cholesterol, Total: 228 mg/dL — ABNORMAL HIGH (ref 100–199)
HDL: 89 mg/dL (ref >39)
LDL Chol Calc (NIH): 132 mg/dL — ABNORMAL HIGH (ref 0–99)
LDL/HDL Ratio: 1.5 ratio (ref 0.0–3.2)
Triglycerides: 41 mg/dL (ref 0–149)
VLDL Cholesterol Cal: 7 mg/dL (ref 5–40)

## 2020-04-18 LAB — RENAL FUNCTION PANEL
Albumin: 4.6 g/dL (ref 3.8–4.9)
BUN/Creatinine Ratio: 11 (ref 9–23)
BUN: 9 mg/dL (ref 6–24)
CO2: 21 mmol/L (ref 20–29)
Calcium: 9.8 mg/dL (ref 8.7–10.2)
Chloride: 99 mmol/L (ref 96–106)
Creatinine, Ser: 0.84 mg/dL (ref 0.57–1.00)
Glucose: 81 mg/dL (ref 65–99)
Phosphorus: 4.4 mg/dL — ABNORMAL HIGH (ref 3.0–4.3)
Potassium: 4.7 mmol/L (ref 3.5–5.2)
Sodium: 137 mmol/L (ref 134–144)
eGFR: 82 mL/min/{1.73_m2} (ref >59)

## 2020-04-18 LAB — CYTOLOGY - PAP
Comment: NEGATIVE
Diagnosis: NEGATIVE
High risk HPV: POSITIVE — AB

## 2020-09-01 ENCOUNTER — Other Ambulatory Visit: Payer: Self-pay | Admitting: Family Medicine

## 2020-09-01 DIAGNOSIS — Z1231 Encounter for screening mammogram for malignant neoplasm of breast: Secondary | ICD-10-CM

## 2020-09-05 ENCOUNTER — Other Ambulatory Visit: Payer: Self-pay

## 2020-09-05 ENCOUNTER — Ambulatory Visit
Admission: RE | Admit: 2020-09-05 | Discharge: 2020-09-05 | Disposition: A | Discharge status: Home / Self Care | Payer: BC Managed Care – PPO | Source: Ambulatory Visit | Attending: Family Medicine | Admitting: Family Medicine

## 2020-09-05 DIAGNOSIS — Z1231 Encounter for screening mammogram for malignant neoplasm of breast: Secondary | ICD-10-CM | POA: Diagnosis not present

## 2021-04-20 ENCOUNTER — Encounter: Payer: Self-pay | Admitting: Family Medicine

## 2021-04-20 ENCOUNTER — Other Ambulatory Visit: Payer: Self-pay

## 2021-04-20 ENCOUNTER — Telehealth: Payer: Self-pay

## 2021-04-20 ENCOUNTER — Ambulatory Visit (INDEPENDENT_AMBULATORY_CARE_PROVIDER_SITE_OTHER): Payer: BC Managed Care – PPO | Admitting: Family Medicine

## 2021-04-20 VITALS — BP 128/78 | HR 62 | Ht 65.0 in | Wt 175.0 lb

## 2021-04-20 DIAGNOSIS — R87629 Unspecified abnormal cytological findings in specimens from vagina: Secondary | ICD-10-CM | POA: Diagnosis not present

## 2021-04-20 DIAGNOSIS — Z0000 Encounter for general adult medical examination without abnormal findings: Secondary | ICD-10-CM

## 2021-04-20 DIAGNOSIS — Z23 Encounter for immunization: Secondary | ICD-10-CM | POA: Diagnosis not present

## 2021-04-20 DIAGNOSIS — Z1231 Encounter for screening mammogram for malignant neoplasm of breast: Secondary | ICD-10-CM

## 2021-04-20 DIAGNOSIS — E7800 Pure hypercholesterolemia, unspecified: Secondary | ICD-10-CM | POA: Diagnosis not present

## 2021-04-20 LAB — HEMOCCULT GUIAC POC 1CARD (OFFICE): Fecal Occult Blood, POC: NEGATIVE

## 2021-04-20 NOTE — Progress Notes (Signed)
Date:  04/20/2021   Name:  Mindy Yu   DOB:  [birth year]   MRN:  [id number]   Chief Complaint: Annual Exam  Patient is a 56 year old female who presents for a comprehensive physical exam. The patient reports the following problems: none. Health maintenance has been reviewed up to date.Mindy Yu is a 56 y.o. female who presents today for her Complete Annual Exam. She feels well. She reports exercising walking. She reports she is sleeping well.       Lab Results  Component Value Date   NA 137 04/17/2020   K 4.7 04/17/2020   CO2 21 04/17/2020   GLUCOSE 81 04/17/2020   BUN 9 04/17/2020   CREATININE 0.84 04/17/2020   CALCIUM 9.8 04/17/2020   EGFR 82 04/17/2020   GFRNONAA 86 06/05/2018   Lab Results  Component Value Date   CHOL 228 (H) 04/17/2020   HDL 89 04/17/2020   LDLCALC 132 (H) 04/17/2020   TRIG 41 04/17/2020   CHOLHDL 2.5 06/05/2018   No results found for: TSH No results found for: HGBA1C No results found for: WBC, HGB, HCT, MCV, PLT No results found for: ALT, AST, GGT, ALKPHOS, BILITOT No results found for: 25OHVITD2, 25OHVITD3, VD25OH   Review of Systems  Constitutional:  Negative for chills and fever.  HENT:  Negative for drooling, ear discharge, ear pain and sore throat.   Respiratory:  Negative for cough, shortness of breath and wheezing.   Cardiovascular:  Negative for chest pain, palpitations and leg swelling.  Gastrointestinal:  Negative for abdominal pain, blood in stool, constipation, diarrhea and nausea.  Endocrine: Negative for polydipsia.  Genitourinary:  Negative for dysuria, frequency, hematuria and urgency.  Musculoskeletal:  Positive for arthralgias. Negative for back pain, myalgias and neck pain.       Right pain  Skin:  Negative for rash.  Allergic/Immunologic: Negative for environmental allergies.  Neurological:  Negative for dizziness and headaches.  Hematological:  Does not bruise/bleed easily.  Psychiatric/Behavioral:   Negative for suicidal ideas. The patient is not nervous/anxious.    There are no problems to display for this patient.   No Known Allergies  History reviewed. No pertinent surgical history.  Social History   Tobacco Use   Smoking status: Former    Types: Cigarettes    Quit date: 02/01/2009    Years since quitting: 12.2   Smokeless tobacco: Never  Substance Use Topics   Alcohol use: Yes    Alcohol/week: 1.0 standard drink    Types: 1 Glasses of wine per week    Comment: nightly   Drug use: Never     Medication list has been reviewed and updated.  No outpatient medications have been marked as taking for the 04/20/21 encounter (Office Visit) with Duanne Limerick, MD.    [facility] 2/9 Scores 04/20/2021 04/17/2020 09/06/2019 12/06/2018  PHQ - 2 Score 0 0 0 1  PHQ- 9 Score 0 0 0 2    GAD 7 : Generalized Anxiety Score 04/20/2021 04/17/2020 09/06/2019 12/06/2018  Nervous, Anxious, on Edge 0 0 0 0  Control/stop worrying 0 0 0 0  Worry too much - different things 0 0 0 0  Trouble relaxing 0 0 0 0  Restless 0 0 0 0  Easily annoyed or irritable 0 0 0 0  Afraid - awful might happen 0 0 0 0  Total GAD 7 Score 0 0 0 0  Anxiety Difficulty Not difficult at all - Not difficult  at all -    BP Readings from Last 3 Encounters:  04/20/21 128/78  04/17/20 130/84  09/06/19 130/88    Physical Exam Vitals and nursing note reviewed. Exam conducted with a chaperone present.  Constitutional:      General: She is not in acute distress.    Appearance: Normal appearance. She is well-groomed. She is not diaphoretic.  HENT:     Head: Normocephalic and atraumatic.     Jaw: There is normal jaw occlusion.     Right Ear: Hearing, tympanic membrane, ear canal and external ear normal.     Left Ear: Hearing, tympanic membrane, ear canal and external ear normal.     Nose: Nose normal.     Right Turbinates: Not swollen or pale.     Left Turbinates: Not swollen or pale.     Mouth/Throat:     Lips: Pink.      Mouth: Mucous membranes are moist.     Dentition: Normal dentition. No dental tenderness or dental caries.     Tongue: No lesions.     Pharynx: Oropharynx is clear. Uvula midline. No pharyngeal swelling, oropharyngeal exudate, posterior oropharyngeal erythema or uvula swelling.     Tonsils: No tonsillar exudate or tonsillar abscesses.  Eyes:     General: Lids are normal. Vision grossly intact. Gaze aligned appropriately.        Right eye: No discharge.        Left eye: No discharge.     Extraocular Movements: Extraocular movements intact.     Conjunctiva/sclera: Conjunctivae normal.     Pupils: Pupils are equal, round, and reactive to light.     Funduscopic exam:    Right eye: Red reflex present.        Left eye: Red reflex present. Neck:     Thyroid: No thyroid mass, thyromegaly or thyroid tenderness.     Vascular: No JVD.  Cardiovascular:     Rate and Rhythm: Normal rate and regular rhythm.     Pulses: Normal pulses.          Carotid pulses are 2+ on the right side and 2+ on the left side.      Radial pulses are 2+ on the right side and 2+ on the left side.       Femoral pulses are 2+ on the right side and 2+ on the left side.      Popliteal pulses are 2+ on the right side and 2+ on the left side.       Dorsalis pedis pulses are 2+ on the right side and 2+ on the left side.       Posterior tibial pulses are 2+ on the right side and 2+ on the left side.     Heart sounds: Normal heart sounds, S1 normal and S2 normal. No murmur heard. No systolic murmur is present.  No diastolic murmur is present.    No friction rub. No gallop. No S3 or S4 sounds.  Pulmonary:     Effort: Pulmonary effort is normal.     Breath sounds: Normal breath sounds. No decreased breath sounds, wheezing, rhonchi or rales.  Chest:     Chest wall: No mass.  Breasts:    Right: Normal. No swelling, bleeding, inverted nipple, mass, nipple discharge, skin change or tenderness.     Left: Normal. No swelling,  bleeding, inverted nipple, mass, nipple discharge, skin change or tenderness.  Abdominal:     General: Bowel sounds are normal.  Palpations: Abdomen is soft. There is no hepatomegaly, splenomegaly or mass.     Tenderness: There is no abdominal tenderness. There is no guarding or rebound.     Hernia: There is no hernia in the umbilical area, ventral area, left inguinal area or right inguinal area.  Genitourinary:    Rectum: Normal. Guaiac result negative. No mass.  Musculoskeletal:        General: Normal range of motion.     Cervical back: Normal, full passive range of motion without pain, normal range of motion and neck supple.     Thoracic back: Normal.     Lumbar back: Normal.     Right lower leg: No edema.     Left lower leg: No edema.  Lymphadenopathy:     Head:     Right side of head: No submental, submandibular or tonsillar adenopathy.     Left side of head: No submental, submandibular or tonsillar adenopathy.     Cervical: No cervical adenopathy.     Right cervical: No superficial, deep or posterior cervical adenopathy.    Left cervical: No superficial, deep or posterior cervical adenopathy.     Upper Body:     Right upper body: No supraclavicular or axillary adenopathy.     Left upper body: No supraclavicular or axillary adenopathy.  Skin:    General: Skin is warm and dry.     Capillary Refill: Capillary refill takes less than 2 seconds.  Neurological:     Mental Status: She is alert.     Cranial Nerves: Cranial nerves 2-12 are intact.     Sensory: Sensation is intact.     Motor: Motor function is intact.     Deep Tendon Reflexes: Reflexes are normal and symmetric.     Reflex Scores:      Tricep reflexes are 2+ on the right side and 2+ on the left side.      Bicep reflexes are 2+ on the right side and 2+ on the left side.      Brachioradialis reflexes are 2+ on the right side and 2+ on the left side.      Patellar reflexes are 2+ on the right side and 2+ on the left  side.      Achilles reflexes are 2+ on the right side and 2+ on the left side. Psychiatric:        Behavior: Behavior is cooperative.    Wt Readings from Last 3 Encounters:  04/20/21 175 lb (79.4 kg)  04/17/20 179 lb (81.2 kg)  09/06/19 186 lb (84.4 kg)    BP 128/78   Pulse 62   Ht 5\' 5"  (1.651 m)   Wt 175 lb (79.4 kg)   SpO2 98%   BMI 29.12 kg/m   Assessment and Plan:  Patient is a 56 year old female who presents for a comprehensive physical exam. The patient reports the following problems: Knee pain followed by sports medicine. Health maintenance has been reviewed.  Patient's chart was reviewed for previous encounters most recent labs most recent imaging in Care Everywhere. 1. Annual physical exam Immunizations are reviewed and recommendations provided.   Age appropriate screening tests are discussed. Counseling given for risk factor reduction interventions.  No subjective/objective concerns noted during HPI, ROS, and physical exam.  We will check lipid panel renal function panel for current status guaiac was noted to be negative. - Lipid Panel With LDL/HDL Ratio - Renal Function Panel - POCT Occult Blood Stool  2. Need  for Tdap vaccination Discussed and administered. - Tdap vaccine greater than or equal to 7yo IM  3. Abnormal ThinPrep Pap test of vagina Patient with history of abnormal Pap smears requiring procedures of cryotherapy and likely colposcopy.  Patient is referred to gynecology for further evaluation and treatment. - Ambulatory referral to Gynecology  4. Breast cancer screening by mammogram Discussed with patient and mammogram obtained. - MM 3D SCREEN BREAST BILATERAL

## 2021-04-20 NOTE — Telephone Encounter (Signed)
August 14th @ 11:00 in [place]- left message on vm per patient's request ?

## 2021-04-21 LAB — RENAL FUNCTION PANEL
Albumin: 4.7 g/dL (ref 3.8–4.9)
BUN/Creatinine Ratio: 14 (ref 9–23)
BUN: 12 mg/dL (ref 6–24)
CO2: 23 mmol/L (ref 20–29)
Calcium: 9.8 mg/dL (ref 8.7–10.2)
Chloride: 103 mmol/L (ref 96–106)
Creatinine, Ser: 0.85 mg/dL (ref 0.57–1.00)
Glucose: 82 mg/dL (ref 70–99)
Phosphorus: 4.3 mg/dL (ref 3.0–4.3)
Potassium: 4.6 mmol/L (ref 3.5–5.2)
Sodium: 139 mmol/L (ref 134–144)
eGFR: 80 mL/min/{1.73_m2} (ref >59)

## 2021-04-21 LAB — LIPID PANEL WITH LDL/HDL RATIO
Cholesterol, Total: 229 mg/dL — ABNORMAL HIGH (ref 100–199)
HDL: 74 mg/dL (ref >39)
LDL Chol Calc (NIH): 147 mg/dL — ABNORMAL HIGH (ref 0–99)
LDL/HDL Ratio: 2 ratio (ref 0.0–3.2)
Triglycerides: 46 mg/dL (ref 0–149)
VLDL Cholesterol Cal: 8 mg/dL (ref 5–40)

## 2021-04-21 NOTE — Patient Instructions (Signed)
GUIDELINES FOR  ?LOW-CHOLESTEROL, LOW-TRIGLYCERIDE DIETS  ?  ?FOODS TO USE  ? ?MEATS, FISH Choose lean meats (chicken, turkey, veal, and non-fatty cuts of beef with excess fat trimmed; one serving = 3 oz of cooked meat). Also, fresh or frozen fish, canned fish packed in water, and shellfish (lobster, crabs, shrimp, and oysters). Limit use to no more than one serving of one of these per week. Shellfish are high in cholesterol but low in saturated fat and should be used sparingly. Meats and fish should be broiled (pan or oven) or baked on a rack.  ?EGGS Egg substitutes and egg whites (use freely). Egg yolks (limit two per week).  ?FRUITS Eat three servings of fresh fruit per day (1 serving = ? cup). Be sure to have at least one citrus fruit daily. Frozen and canned fruit with no sugar or syrup added may be used.  ?VEGETABLES Most vegetables are not limited (see next page). One dark-green (string beans, escarole) or one deep yellow (squash) vegetable is recommended daily. Cauliflower, broccoli, and celery, as well as potato skins, are recommended for their fiber content. (Fiber is associated with cholesterol reduction) It is preferable to steam vegetables, but they may be boiled, strained, or braised with polyunsaturated vegetable oil (see below).  ?BEANS Dried peas or beans (1 serving = ? cup) may be used as a bread substitute.  ?NUTS Almonds, walnuts, and peanuts may be used sparingly  ?(1 serving = 1 Tablespoonful). Use pumpkin, sesame, or sunflower seeds.  ?BREADS, GRAINS One roll or one slice of whole grain or enriched bread may be used, or three soda crackers or four pieces of melba toast as a substitute. Spaghetti, rice or noodles (? cup) or ? large ear of corn may be used as a bread substitute. In preparing these foods do not use butter or shortening, use soft margarine. Also use egg and sugar substitutes.  Choose high fiber grains, such as oats and whole wheat.  ?CEREALS Use ? cup of hot cereal or ? cup of  cold cereal per day. Add a sugar substitute if desired, with 99% fat free or skim milk.  ?MILK PRODUCTS Always use 99% fat free or skim milk, dairy products such as low fat cheeses (farmer's uncreamed diet cottage), low-fat yogurt, and powdered skim milk.  ?FATS, OILS Use soft (not stick) margarine; vegetable oils that are high in polyunsaturated fats (such as safflower, sunflower, soybean, corn, and cottonseed). Always refrigerate meat drippings to harden the fat and remove it before preparing gravies  ?DESSERTS, SNACKS Limit to two servings per day; substitute each serving for a bread/cereal serving: ice milk, water sherbet (1/4 cup); unflavored gelatin or gelatin flavored with sugar substitute (1/3 cup); pudding prepared with skim milk (1/2 cup); egg white souffl?s; unbuttered popcorn (1 ? cups). Substitute carob for chocolate.  ?BEVERAGES Fresh fruit juices (limit 4 oz per day); black coffee, plain or herbal teas; soft drinks with sugar substitutes; club soda, preferably salt-free; cocoa made with skim milk or nonfat dried milk and water (sugar substitute added if desired); clear broth. Alcohol: limit two servings per day (see second page).  ?MISCELLANEOUS ? You may use the following freely: vinegar, spices, herbs, nonfat bouillon, mustard, Worcestershire sauce, soy sauce, flavoring essence.  ? ? ? ? ? ? ? ? ? ? ? ? ? ? ? ? ?GUIDELINES FOR  ?LOW-CHOLESTEROL, LOW TRIGLYCERIDE DIETS  ?  ?FOODS TO AVOID  ? ?MEATS, FISH Marbled beef, pork, bacon, sausage, and other pork products; fatty   fowl (duck, goose); skin and fat of turkey and chicken; processed meats; luncheon meats (salami, bologna); frankfurters and fast-food hamburgers (theyre loaded with fat); organ meats (kidneys, liver); canned fish packed in oil.  ?EGGS Limit egg yolks to two per week.   ?FRUITS Coconuts (rich in saturated fats).  ?VEGETABLES Avoid avocados. Starchy vegetables (potatoes, corn, lima beans, dried peas, beans) may be used only if  substitutes for a serving of bread or cereal. (Baked potato skin, however, is desirable for its fiber content.  ?BEANS Commercial baked beans with sugar and/or pork added.  ?NUTS Avoid nuts.  Limit peanuts and walnuts to one tablespoonful per day.  ?BREADS, GRAINS Any baked goods with shortening and/or sugar. Commercial mixes with dried eggs and whole milk. Avoid sweet rolls, doughnuts, breakfast pastries (Danish), and sweetened packaged cereals (the added sugar converts readily to triglycerides).  ?MILK PRODUCTS Whole milk and whole-milk packaged goods; cream; ice cream; whole-milk puddings, yogurt, or cheeses; nondairy cream substitutes.  ?FATS, OILS Butter, lard, animal fats, bacon drippings, gravies, cream sauces as well as palm and coconut oils. All these are high in saturated fats. Examine labels on cholesterol free products for hydrogenated fats. (These are oils that have been hardened into solids and in the process have become saturated.)  ?DESSERTS, SNACKS Fried snack foods like potato chips; chocolate; candies in general; jams, jellies, syrups; whole- milk puddings; ice cream and milk sherbets; hydrogenated peanut butter.  ?BEVERAGES Sugared fruit juices and soft drinks; cocoa made with whole milk and/or sugar. When using alcohol (1 oz liquor, 5 oz beer, or 2 ? oz dry table wine per serving), one serving must be substituted for one bread or cereal serving (limit, two servings of alcohol per day).  ? SPECIAL NOTES  ?  Remember that even non-limited foods should be used in moderation. ?While on a cholesterol-lowering diet, be sure to avoid animal fats and marbled meats. ?3. While on a triglyceride-lowering diet, be sure to avoid sweets and to control the amount of carbohydrates you eat (starchy foods such as flour, bread, potatoes).While on a tri-glyceride-lowering diet, be sure to avoid sweets ?Buy a good low-fat cookbook, such as the one published by the American Heart Association. ?Consult your physician  if you have any questions.  ? ? ? ? ? ? ? ? ? ? ? ? ? ?[facility] Low Glycemic Diet Plan ? ? ?Low Glycemic Foods (20-49) Moderate Glycemic Foods (50-69) High Glycemic Foods (70-100)  ?    ?Breakfast Creals Breakfast Cereals Breakfast Cereals  ?All Bran All-Bran Fruit'n Oats  ? Bran Buds Bran Chex  ? Cheerios Corn chex  ?  ?Fiber One Oatmeal (not instant)  ? Just Right Mini-Wheats  ? Corn Flakes Cream of Wheat  ?  ?Oat Bran Special K Swiss Muesli  ? Grape Nuts Grape Nut Flakes  ?  ?  Grits Nutri-Grain  ?  ?Fruits and fruit juice: Fruits Puffed Rice Puffed Wheat  ?  ?(Limit to 1-2 Servings per day) Banana (under-ride) Dates  ? Rice Chex Rice Krispies  ?  ?Apples Apricots (fresh/dried)  ? Figs Grapes  ? Shredded Wheat Team  ?  ?Blackberries Blueberries  ? Kiwi Mango  ? Total   ?  ?Cherries Cranberries  ? Oranges Raisins  ?   ?Peaches Pears  ?  Fruits  ?Plums Prunes  ? Fruit Juices Pineapple Watermelon  ?  ?Grapefruit Raspberries  ? Cranberry Juice Orange Juice  ? Banana (over-ripe)   ?  ?Strawberries Tangerines  ?    ?  Apple Juice Grapefruit Juice  ? Beans and Legumes Beverages  ?Tomato Juice   ? Boston-type baked beans Sodas, sweet tea, pineapple juice  ? Canned pinto, kidney, or navy beans   ?Beans and Legumes (fresh-cooked) Green peas Vegetables  ?Black-eyed peas Butter Beans  ?  Potato, baked, boiled, fried, mashed  ?Chick peas Lentils  ? Vegetables French fries  ?Green beans Lima beans  ? Beets Carrots  ? Canned or frozen corn  ?Kidney beans Navy beans  ? Sweet potato Yam  ? Parsnips  ?Pinto beans Snow peas  ? Corn on the cob Winter squash  ?    ?Non-starchy vegetables Grains Breads  ?Asparagus, avocado, broccoli, cabbage Cornmeal Rice, brown  ? Most breads (white and whole grain)  ?cauliflower, celery, cucumber, greens Rice, white Couscous  ? Bagels Bread sticks  ?  ?lettuce, mushrooms, peppers, tomatoes  Bread stuffing Kaiser roll  ?  ?okra, onions, spinach, summer squash Pasta Dinner rolls  ? Macaroni  Pizza, cheese  ?   ?Grains Ravioli, meat filled Spaghetti, white  ? Grains  ?Barley Bulgur  ?  Rice, instant Tapioca, with milk  ?  ?Rye Wild rice  ? Nuts   ? Cashews Macadamia  ? Candy and most cookies  ?Nuts and o

## 2021-05-18 DIAGNOSIS — Z8619 Personal history of other infectious and parasitic diseases: Secondary | ICD-10-CM | POA: Diagnosis not present

## 2021-05-18 DIAGNOSIS — Z01419 Encounter for gynecological examination (general) (routine) without abnormal findings: Secondary | ICD-10-CM | POA: Diagnosis not present

## 2021-09-10 ENCOUNTER — Ambulatory Visit
Admission: RE | Admit: 2021-09-10 | Discharge: 2021-09-10 | Disposition: A | Discharge status: Home / Self Care | Payer: BC Managed Care – PPO | Source: Ambulatory Visit | Attending: Family Medicine | Admitting: Family Medicine

## 2021-09-10 DIAGNOSIS — Z1231 Encounter for screening mammogram for malignant neoplasm of breast: Secondary | ICD-10-CM | POA: Insufficient documentation

## 2022-07-02 ENCOUNTER — Telehealth: Payer: Self-pay | Admitting: Physician Assistant

## 2022-07-05 ENCOUNTER — Encounter: Payer: Self-pay | Admitting: Physician Assistant

## 2022-07-05 ENCOUNTER — Ambulatory Visit: Payer: BC Managed Care – PPO | Admitting: Physician Assistant

## 2022-07-05 VITALS — BP 155/100 | HR 73 | Temp 98.1°F | Resp 14 | Ht 65.0 in | Wt 191.0 lb

## 2022-07-05 DIAGNOSIS — E7800 Pure hypercholesterolemia, unspecified: Secondary | ICD-10-CM | POA: Insufficient documentation

## 2022-07-05 DIAGNOSIS — Z0000 Encounter for general adult medical examination without abnormal findings: Secondary | ICD-10-CM | POA: Diagnosis not present

## 2022-07-05 DIAGNOSIS — R030 Elevated blood-pressure reading, without diagnosis of hypertension: Secondary | ICD-10-CM | POA: Insufficient documentation

## 2022-07-05 DIAGNOSIS — M25562 Pain in left knee: Secondary | ICD-10-CM

## 2022-07-05 DIAGNOSIS — M25561 Pain in right knee: Secondary | ICD-10-CM

## 2022-07-05 DIAGNOSIS — G8929 Other chronic pain: Secondary | ICD-10-CM | POA: Diagnosis not present

## 2022-07-05 DIAGNOSIS — Z1211 Encounter for screening for malignant neoplasm of colon: Secondary | ICD-10-CM | POA: Diagnosis not present

## 2022-07-05 NOTE — Progress Notes (Signed)
I,Mindy Yu,acting as a Neurosurgeon for [employer], PA-C.,have documented all relevant documentation on the behalf of Mindy Ferguson, PA-C,as directed by  Mindy Ferguson, PA-C while in the presence of Mindy Ferguson, PA-C.   New patient visit   Patient: Mindy Yu   DOB: [birth year]   57 y.o. Female  MRN: [id number] Visit Date: 07/05/2022  Today's healthcare provider: Alfredia Ferguson, PA-C   Cc. Cpe, knee pain  Subjective    Mindy Yu is a 57 y.o. female who presents today as a new patient to establish care.  HPI  Discussed the use of AI scribe software for clinical note transcription with the patient, who gave verbal consent to proceed.  History of Present Illness   Pt presents today to establish care and would like a physical.  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 She reports bilateral knee pain, which is worse on the right side. The pain is located in the front of the knee and is described as pressure. The pain is present daily, particularly upon waking and when the patient is stiff. The patient has been using ice for relief and has tried Aleve, but found it made them groggy. The knee pain has been limiting the patient's ability to walk, which they enjoy and want to continue for health reasons.  The patient is a former smoker and reports occasional alcohol consumption. They do not take any medications and have no other significant medical history.  They report that their blood pressure is usually normal when checked at work and during previous medical visits. They deny any symptoms of hypertension.       Past Medical History:  Diagnosis Date   Hyperlipidemia    Hypertension    History reviewed. No pertinent surgical history. Family Status  Relation Name Status   Mother  (Not Specified)   Brother  (Not Specified)   MGM  (Not Specified)   PGM  (Not Specified)   PGF  (Not Specified)   Family History  Problem Relation Age of Onset   Cancer Mother         multiple myeloma   Hypertension Mother    Heart disease Brother    Cancer Maternal Grandmother    Breast cancer Paternal Grandmother        49's   Diabetes Paternal Grandmother    Cancer Paternal Grandmother    Heart disease Paternal Grandfather    Social History   Socioeconomic History   Marital status: Divorced    Spouse name: Not on file   Number of children: Not on file   Years of education: Not on file   Highest education level: Not on file  Occupational History   Not on file  Tobacco Use   Smoking status: Former    Types: Cigarettes    Quit date: 02/01/2009    Years since quitting: 13.4   Smokeless tobacco: Never  Substance and Sexual Activity   Alcohol use: Yes    Alcohol/week: 1.0 standard drink of alcohol    Types: 1 Glasses of wine per week    Comment: sparingly   Drug use: Never   Sexual activity: Not Currently  Other Topics Concern   Not on file  Social History Narrative   Not on file   Social Determinants of Health   Financial Resource Strain: Not on file  Food Insecurity: Not on file  Transportation Needs: Not on file  Physical Activity: Not on file  Stress: Not  on file  Social Connections: Not on file   No outpatient medications prior to visit.   No facility-administered medications prior to visit.   No Known Allergies  Immunization History  Administered Date(s) Administered   Influenza-Unspecified 11/02/2018   Moderna Sars-Covid-2 Vaccination 03/14/2019, 04/13/2019, 01/11/2020   Tdap 04/20/2021    Health Maintenance  Topic Date Due   Hepatitis C Screening  Never done   Zoster Vaccines- Shingrix (1 of 2) Never done   Colonoscopy  07/27/2016   COVID-19 Vaccine (4 - 2023-24 season) 10/02/2021   INFLUENZA VACCINE  09/02/2022   MAMMOGRAM  09/11/2022   PAP SMEAR-Modifier  04/18/2023   DTaP/Tdap/Td (2 - Td or Tdap) 04/21/2031   HPV VACCINES  Aged Out   HIV Screening  Discontinued    Patient Care Team: Mindy Ferguson, PA-C as PCP -  General (Physician Assistant)  Review of Systems  Cardiovascular:  Positive for leg swelling.  Musculoskeletal:  Positive for arthralgias.  All other systems reviewed and are negative.     Objective    BP (!) 155/100 (BP Location: Right Arm, Patient Position: Sitting, Cuff Size: Normal)   Pulse 73   Temp 98.1 F (36.7 C) (Oral)   Resp 14   Ht 5\' 5"  (1.651 m)   Wt 191 lb (86.6 kg)   SpO2 100%   BMI 31.78 kg/m    Physical Exam Constitutional:      General: She is awake.     Appearance: She is well-developed. She is not ill-appearing.  HENT:     Head: Normocephalic.     Right Ear: Tympanic membrane normal.     Left Ear: Tympanic membrane normal.     Nose: Nose normal. No congestion or rhinorrhea.     Mouth/Throat:     Pharynx: No oropharyngeal exudate or posterior oropharyngeal erythema.  Eyes:     Conjunctiva/sclera: Conjunctivae normal.     Pupils: Pupils are equal, round, and reactive to light.  Neck:     Thyroid: No thyroid mass or thyromegaly.  Cardiovascular:     Rate and Rhythm: Normal rate and regular rhythm.     Heart sounds: Normal heart sounds.  Pulmonary:     Effort: Pulmonary effort is normal.     Breath sounds: Normal breath sounds.  Abdominal:     Palpations: Abdomen is soft.     Tenderness: There is no abdominal tenderness.  Musculoskeletal:     Right lower leg: No swelling. No edema.     Left lower leg: No swelling. No edema.     Comments: Trace edema L ankle  Lymphadenopathy:     Cervical: No cervical adenopathy.  Skin:    General: Skin is warm.  Neurological:     Mental Status: She is alert and oriented to person, place, and time.  Psychiatric:        Attention and Perception: Attention normal.        Mood and Affect: Mood normal.        Speech: Speech normal.        Behavior: Behavior normal. Behavior is cooperative.    Depression Screen    07/05/2022   10:37 AM 04/20/2021    9:59 AM 04/17/2020    9:51 AM 09/06/2019   10:02 AM  PHQ  2/9 Scores  PHQ - 2 Score 0 0 0 0  PHQ- 9 Score 0 0 0 0   No results found for any visits on 07/05/22.  Assessment & Plan  Problem List Items Addressed This Visit       Other   Pure hypercholesterolemia    Unmedicated Repeat fasting lipids      Elevated blood pressure reading    Elevated blood pressure noted during today's visit, however, patient reports normal readings at home and at work. Patient's past visits also show normal blood pressure readings. -Advise patient to continue monitoring blood pressure at home. F/b 1 mo      Chronic pain of both knees    Chronic bilateral knee pain, worse on the right side. Pain is daily and affects mobility. 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 Patient is mentally prepared for possible surgery. -Refer to orthopedic specialist for further evaluation and management. -Consider use of compression socks for symptomatic relief of occasional lower extremity swelling.      Relevant Orders   Ambulatory referral to Orthopedics   Other Visit Diagnoses     Annual physical exam    -  Primary   Relevant Orders   CBC w/Diff/Platelet   Comprehensive Metabolic Panel (CMET)   HgB A1c   Lipid Profile   Colon cancer screening       Relevant Orders   Ambulatory referral to Gastroenterology        Return in about 4 weeks (around 08/02/2022) for hypertension.     I, Mindy Ferguson, PA-C have reviewed all documentation for this visit. The documentation on  07/05/22   for the exam, diagnosis, procedures, and orders are all accurate and complete.  Mindy Ferguson, PA-C [facility] [address] [place], [place], [address] Office: [phone] Fax: [phone]   [facility]

## 2022-07-05 NOTE — Assessment & Plan Note (Signed)
Chronic bilateral knee pain, worse on the right side. Pain is daily and affects mobility. 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 Patient is mentally prepared for possible surgery. -Refer to orthopedic specialist for further evaluation and management. -Consider use of compression socks for symptomatic relief of occasional lower extremity swelling.

## 2022-07-05 NOTE — Assessment & Plan Note (Signed)
Unmedicated Repeat fasting lipids

## 2022-07-05 NOTE — Assessment & Plan Note (Signed)
Elevated blood pressure noted during today's visit, however, patient reports normal readings at home and at work. Patient's past visits also show normal blood pressure readings. -Advise patient to continue monitoring blood pressure at home. F/b 1 mo

## 2022-07-06 LAB — COMPREHENSIVE METABOLIC PANEL
ALT: 18 IU/L (ref 0–32)
AST: 22 IU/L (ref 0–40)
Albumin/Globulin Ratio: 1.6 (ref 1.2–2.2)
Albumin: 4.4 g/dL (ref 3.8–4.9)
Alkaline Phosphatase: 76 IU/L (ref 44–121)
BUN/Creatinine Ratio: 8 — ABNORMAL LOW (ref 9–23)
BUN: 7 mg/dL (ref 6–24)
Bilirubin Total: 0.7 mg/dL (ref 0.0–1.2)
CO2: 21 mmol/L (ref 20–29)
Calcium: 10 mg/dL (ref 8.7–10.2)
Chloride: 101 mmol/L (ref 96–106)
Creatinine, Ser: 0.92 mg/dL (ref 0.57–1.00)
Globulin, Total: 2.8 g/dL (ref 1.5–4.5)
Glucose: 93 mg/dL (ref 70–99)
Potassium: 4.2 mmol/L (ref 3.5–5.2)
Sodium: 138 mmol/L (ref 134–144)
Total Protein: 7.2 g/dL (ref 6.0–8.5)
eGFR: 72 mL/min/{1.73_m2} (ref >59)

## 2022-07-06 LAB — CBC WITH DIFFERENTIAL/PLATELET
Basophils Absolute: 0 10*3/uL (ref 0.0–0.2)
Basos: 1 %
EOS (ABSOLUTE): 0 10*3/uL (ref 0.0–0.4)
Eos: 1 %
Hematocrit: 41.1 % (ref 34.0–46.6)
Hemoglobin: 13.4 g/dL (ref 11.1–15.9)
Immature Grans (Abs): 0 10*3/uL (ref 0.0–0.1)
Immature Granulocytes: 0 %
Lymphocytes Absolute: 1.3 10*3/uL (ref 0.7–3.1)
Lymphs: 31 %
MCH: 28.9 pg (ref 26.6–33.0)
MCHC: 32.6 g/dL (ref 31.5–35.7)
MCV: 89 fL (ref 79–97)
Monocytes Absolute: 0.3 10*3/uL (ref 0.1–0.9)
Monocytes: 8 %
Neutrophils Absolute: 2.4 10*3/uL (ref 1.4–7.0)
Neutrophils: 59 %
Platelets: 357 10*3/uL (ref 150–450)
RBC: 4.64 x10E6/uL (ref 3.77–5.28)
RDW: 12.1 % (ref 11.7–15.4)
WBC: 4.1 10*3/uL (ref 3.4–10.8)

## 2022-07-06 LAB — LIPID PANEL
Chol/HDL Ratio: 2.5 ratio (ref 0.0–4.4)
Cholesterol, Total: 221 mg/dL — ABNORMAL HIGH (ref 100–199)
HDL: 90 mg/dL (ref >39)
LDL Chol Calc (NIH): 121 mg/dL — ABNORMAL HIGH (ref 0–99)
Triglycerides: 57 mg/dL (ref 0–149)
VLDL Cholesterol Cal: 10 mg/dL (ref 5–40)

## 2022-07-06 LAB — HEMOGLOBIN A1C
Est. average glucose Bld gHb Est-mCnc: 103 mg/dL
Hgb A1c MFr Bld: 5.2 % (ref 4.8–5.6)

## 2022-07-07 ENCOUNTER — Telehealth: Payer: Self-pay

## 2022-07-07 NOTE — Telephone Encounter (Signed)
Pt returned call regarding screening colonoscopy referral   Pt states she works on some days during the day and she may have to call you back if you call during that time.Marland KitchenMarland Kitchen

## 2022-07-08 ENCOUNTER — Other Ambulatory Visit: Payer: Self-pay | Admitting: *Deleted

## 2022-07-08 ENCOUNTER — Telehealth: Payer: Self-pay | Admitting: *Deleted

## 2022-07-08 DIAGNOSIS — Z1211 Encounter for screening for malignant neoplasm of colon: Secondary | ICD-10-CM

## 2022-07-08 MED ORDER — NA SULFATE-K SULFATE-MG SULF 17.5-3.13-1.6 GM/177ML PO SOLN: 1.0000 | Freq: Once | ORAL | 0 refills | Status: AC

## 2022-07-08 NOTE — Telephone Encounter (Addendum)
Gastroenterology Pre-Procedure Review  Request Date: 09/08/2022 Requesting Physician: Dr. Tobi Bastos  PATIENT REVIEW QUESTIONS: The patient responded to the following health history questions as indicated:    1. Are you having any GI issues? no 2. Do you have a personal history of Polyps? no 3. Do you have a family history of Colon Cancer or Polyps? Yes(mother had polyps and grandmother had colon cancer) 4. Diabetes Mellitus? no 5. Joint replacements in the past 12 months?no 6. Major health problems in the past 3 months?no 7. Any artificial heart valves, MVP, or defibrillator?no    MEDICATIONS & ALLERGIES:    Patient reports the following regarding taking any anticoagulation/antiplatelet therapy:   Plavix, Coumadin, Eliquis, Xarelto, Lovenox, Pradaxa, Brilinta, or Effient? no Aspirin? no  Patient confirms/reports the following medications:  Current Outpatient Medications  Medication Sig Dispense Refill   Na Sulfate-K Sulfate-Mg Sulf 17.5-3.13-1.6 GM/177ML SOLN Take 1 kit by mouth once for 1 dose. 354 mL 0   No current facility-administered medications for this visit.    Patient confirms/reports the following allergies:  No Known Allergies  No orders of the defined types were placed in this encounter.   AUTHORIZATION INFORMATION Primary Insurance: 1D#: Group #:  Secondary Insurance: 1D#: Group #:  SCHEDULE INFORMATION: Date: 09/08/2022 Time: Location:  [facility]

## 2022-07-08 NOTE — Telephone Encounter (Signed)
Colonoscopy schedule on 09/08/2022 with Dr Tobi Bastos at [facility]

## 2022-07-16 DIAGNOSIS — M170 Bilateral primary osteoarthritis of knee: Secondary | ICD-10-CM | POA: Diagnosis not present

## 2022-08-06 ENCOUNTER — Encounter: Payer: Self-pay | Admitting: Physician Assistant

## 2022-08-06 ENCOUNTER — Ambulatory Visit: Payer: BC Managed Care – PPO | Admitting: Physician Assistant

## 2022-08-06 DIAGNOSIS — R030 Elevated blood-pressure reading, without diagnosis of hypertension: Secondary | ICD-10-CM

## 2022-08-06 NOTE — Assessment & Plan Note (Addendum)
Still elevated in office,I am considering this white coat HTN as number are in range outside office Advised pt to cont to monitor for change F/u annually or earlier if needed

## 2022-08-06 NOTE — Progress Notes (Signed)
      Established patient visit   Patient: Mindy Yu   DOB: [birth year]   57 y.o. Female  MRN: [id number] Visit Date: 08/06/2022  Today's healthcare provider: Alfredia Ferguson, PA-C   Chief Complaint  Patient presents with   Follow-up   Subjective    HPI  Hypertension, follow-up  BP Readings from Last 3 Encounters:  08/06/22 122/70  07/05/22 (!) 155/100  04/20/21 128/78   Wt Readings from Last 3 Encounters:  08/06/22 187 lb (84.8 kg)  07/05/22 191 lb (86.6 kg)  04/20/21 175 lb (79.4 kg)      Outside blood pressures are 122/70.She checks at work and it is always in range.  Pertinent labs Lab Results  Component Value Date   CHOL 221 (H) 07/05/2022   HDL 90 07/05/2022   LDLCALC 121 (H) 07/05/2022   TRIG 57 07/05/2022   CHOLHDL 2.5 07/05/2022   Lab Results  Component Value Date   NA 138 07/05/2022   K 4.2 07/05/2022   CREATININE 0.92 07/05/2022   EGFR 72 07/05/2022   GLUCOSE 93 07/05/2022     The 10-year ASCVD risk score (Arnett DK, et al., 2019) is: 2.8%  ---------------------------------------------------------------------------------------------------  Medications: Outpatient Medications Prior to Visit  Medication Sig   meloxicam (MOBIC) 15 MG tablet Take 15 mg by mouth daily.   No facility-administered medications prior to visit.    Review of Systems  Constitutional:  Negative for fatigue and fever.  Respiratory:  Negative for cough and shortness of breath.   Cardiovascular:  Negative for chest pain and leg swelling.  Gastrointestinal:  Negative for abdominal pain.  Neurological:  Negative for dizziness and headaches.      Objective      Physical Exam Vitals reviewed.  Constitutional:      Appearance: She is not ill-appearing.  HENT:     Head: Normocephalic.  Eyes:     Conjunctiva/sclera: Conjunctivae normal.  Cardiovascular:     Rate and Rhythm: Normal rate.  Pulmonary:     Effort: Pulmonary effort is normal. No respiratory  distress.  Neurological:     General: No focal deficit present.     Mental Status: She is alert and oriented to person, place, and time.  Psychiatric:        Mood and Affect: Mood normal.        Behavior: Behavior normal.     No results found for any visits on 08/06/22.  Assessment & Plan     Problem List Items Addressed This Visit       Other   Elevated blood pressure reading    Still elevated in office,I am considering this white coat HTN as number are in range outside office Advised pt to cont to monitor for change F/u annually or earlier if needed       Return in about 1 year (around 08/06/2023) for CPE.      I, Alfredia Ferguson, PA-C have reviewed all documentation for this visit. The documentation on  08/06/22   for the exam, diagnosis, procedures, and orders are all accurate and complete.  Alfredia Ferguson, PA-C [facility] [address] [place], [place], [address] Office: [phone] Fax: [phone]   [facility]

## 2022-08-18 ENCOUNTER — Other Ambulatory Visit: Payer: Self-pay | Admitting: Physician Assistant

## 2022-08-18 DIAGNOSIS — Z1231 Encounter for screening mammogram for malignant neoplasm of breast: Secondary | ICD-10-CM

## 2022-08-31 ENCOUNTER — Telehealth: Payer: Self-pay

## 2022-08-31 NOTE — Telephone Encounter (Signed)
Patient is currently scheduled for Colonoscopy with Dr. Tobi Bastos on 09/08/22.  There has been a schedule change for Dr. Tobi Bastos and he will not be in the office.  Voice message has been left for her to call office back to reschedule.  Thanks, [place], [place]

## 2022-08-31 NOTE — Telephone Encounter (Signed)
Contacted patient to let her know to disregard her earlier voicemail asking to reschedule because we are able to keep her colonoscopy as scheduled for 09/08/22.  Thanks, [employer], [place]

## 2022-09-01 ENCOUNTER — Encounter: Payer: Self-pay | Admitting: Gastroenterology

## 2022-09-08 ENCOUNTER — Encounter
Admission: RE | Disposition: A | Discharge status: Home / Self Care | Payer: Self-pay | Source: Home / Self Care | Attending: Gastroenterology

## 2022-09-08 ENCOUNTER — Ambulatory Visit
Admission: RE | Admit: 2022-09-08 | Discharge: 2022-09-08 | Disposition: A | Discharge status: Home / Self Care | Payer: BC Managed Care – PPO | Attending: Gastroenterology | Admitting: Gastroenterology

## 2022-09-08 ENCOUNTER — Ambulatory Visit: Payer: BC Managed Care – PPO | Admitting: Anesthesiology

## 2022-09-08 DIAGNOSIS — Z1211 Encounter for screening for malignant neoplasm of colon: Secondary | ICD-10-CM

## 2022-09-08 DIAGNOSIS — I10 Essential (primary) hypertension: Secondary | ICD-10-CM | POA: Diagnosis not present

## 2022-09-08 DIAGNOSIS — Z87891 Personal history of nicotine dependence: Secondary | ICD-10-CM | POA: Diagnosis not present

## 2022-09-08 HISTORY — PX: COLONOSCOPY WITH PROPOFOL: SHX5780

## 2022-09-08 SURGERY — COLONOSCOPY WITH PROPOFOL
Anesthesia: General

## 2022-09-08 MED ORDER — LIDOCAINE HCL (CARDIAC) PF 100 MG/5ML IV SOSY
PREFILLED_SYRINGE | INTRAVENOUS | Status: DC | PRN
  Administered 2022-09-08: 50 mg via INTRAVENOUS

## 2022-09-08 MED ORDER — PROPOFOL 500 MG/50ML IV EMUL
INTRAVENOUS | Status: DC | PRN
  Administered 2022-09-08: 100 ug/kg/min via INTRAVENOUS

## 2022-09-08 MED ORDER — PROPOFOL 10 MG/ML IV BOLUS
INTRAVENOUS | Status: DC | PRN
Start: 2022-09-08 — End: 2022-09-08
  Administered 2022-09-08: 80 mg via INTRAVENOUS
  Administered 2022-09-08: 20 mg via INTRAVENOUS

## 2022-09-08 MED ORDER — SODIUM CHLORIDE 0.9 % IV SOLN
INTRAVENOUS | Status: DC
  Administered 2022-09-08: 20 mL/h via INTRAVENOUS

## 2022-09-08 MED ORDER — LIDOCAINE HCL (PF) 2 % IJ SOLN
INTRAMUSCULAR | Status: AC
  Filled 2022-09-08: qty 5

## 2022-09-08 MED ORDER — PROPOFOL 10 MG/ML IV BOLUS
INTRAVENOUS | Status: AC
  Filled 2022-09-08: qty 40

## 2022-09-08 MED ORDER — ONDANSETRON HCL 4 MG/2ML IJ SOLN
INTRAMUSCULAR | Status: AC
  Filled 2022-09-08: qty 2

## 2022-09-08 NOTE — Anesthesia Preprocedure Evaluation (Signed)
Anesthesia Evaluation  Patient identified by MRN, date of birth, ID band Patient awake    Reviewed: Allergy & Precautions, NPO status , Patient's Chart, lab work & pertinent test results  History of Anesthesia Complications Negative for: history of anesthetic complications  Airway Mallampati: II  TM Distance: >3 FB Neck ROM: Full    Dental  (+) Teeth Intact, Implants   Pulmonary neg pulmonary ROS, neg sleep apnea, neg COPD, Patient abstained from smoking.Not current smoker, former smoker   Pulmonary exam normal breath sounds clear to auscultation       Cardiovascular Exercise Tolerance: Good METShypertension, Pt. on medications (-) CAD and (-) Past MI (-) dysrhythmias  Rhythm:Regular Rate:Normal - Systolic murmurs    Neuro/Psych negative neurological ROS  negative psych ROS   GI/Hepatic ,neg GERD  ,,(+)     (-) substance abuse    Endo/Other  neg diabetes    Renal/GU negative Renal ROS     Musculoskeletal   Abdominal   Peds  Hematology   Anesthesia Other Findings Past Medical History: No date: Hyperlipidemia No date: Hypertension  Reproductive/Obstetrics                              Anesthesia Physical Anesthesia Plan  ASA: 2  Anesthesia Plan: General   Post-op Pain Management: Minimal or no pain anticipated   Induction: Intravenous  PONV Risk Score and Plan: 3 and Propofol infusion, TIVA and Ondansetron  Airway Management Planned: Nasal Cannula  Additional Equipment: None  Intra-op Plan:   Post-operative Plan:   Informed Consent: I have reviewed the patients History and Physical, chart, labs and discussed the procedure including the risks, benefits and alternatives for the proposed anesthesia with the patient or authorized representative who has indicated his/her understanding and acceptance.     Dental advisory given  Plan Discussed with: CRNA and  Surgeon  Anesthesia Plan Comments: (Discussed risks of anesthesia with patient, including possibility of difficulty with spontaneous ventilation under anesthesia necessitating airway intervention, PONV, and rare risks such as cardiac or respiratory or neurological events, and allergic reactions. Discussed the role of CRNA in patient's perioperative care. Patient understands.)         Anesthesia Quick Evaluation

## 2022-09-08 NOTE — Op Note (Signed)
[facility] Gastroenterology Patient Name: Mindy Yu Procedure Date: 09/08/2022 8:28 AM MRN: [id number] Account #: [id number] Date of Birth: January 18, 1966 Admit Type: Outpatient Age: 57 Room: [place] ENDO ROOM 3 Gender: Female Note Status: Finalized Instrument Name: Prentice Docker [id number] Procedure:             Colonoscopy Indications:           Screening for colorectal malignant neoplasm Providers:             Wyline Mood MD, MD Referring MD:          Alfredia Ferguson (Referring MD) Medicines:             Monitored Anesthesia Care Complications:         No immediate complications. Procedure:             Pre-Anesthesia Assessment:                        - Prior to the procedure, a History and Physical was                         performed, and patient medications, allergies and                         sensitivities were reviewed. The patient's tolerance                         of previous anesthesia was reviewed.                        - The risks and benefits of the procedure and the                         sedation options and risks were discussed with the                         patient. All questions were answered and informed                         consent was obtained.                        - ASA Grade Assessment: II - A patient with mild                         systemic disease.                        After obtaining informed consent, the colonoscope was                         passed under direct vision. Throughout the procedure,                         the patient's blood pressure, pulse, and oxygen                         saturations were monitored continuously. The                         Colonoscope was introduced through  the anus and                         advanced to the the cecum, identified by the                         appendiceal orifice. The colonoscopy was performed                         with ease. The patient tolerated the procedure well.                          The quality of the bowel preparation was excellent.                         The ileocecal valve, appendiceal orifice, and rectum                         were photographed. Findings:      The perianal and digital rectal examinations were normal.      The entire examined colon appeared normal on direct and retroflexion       views. Impression:            - The entire examined colon is normal on direct and                         retroflexion views.                        - No specimens collected. Recommendation:        - Discharge patient to home (with escort).                        - Resume previous diet.                        - Continue present medications.                        - Repeat colonoscopy in 10 years for screening                         purposes. Procedure Code(s):     --- Professional ---                        [phone], Colonoscopy, flexible; diagnostic, including                         collection of specimen(s) by brushing or washing, when                         performed (separate procedure) Diagnosis Code(s):     --- Professional ---                        Z12.11, Encounter for screening for malignant neoplasm                         of colon CPT copyright 2022 American Medical Association. All rights reserved. The codes documented in this report are  preliminary and upon coder review may  be revised to meet current compliance requirements. Wyline Mood, MD Wyline Mood MD, MD 09/08/2022 8:53:23 AM This report has been signed electronically. Number of Addenda: 0 Note Initiated On: 09/08/2022 8:28 AM Scope Withdrawal Time: 0 hours 12 minutes 40 seconds  Total Procedure Duration: 0 hours 16 minutes 47 seconds  Estimated Blood Loss:  Estimated blood loss: none.      [facility]

## 2022-09-08 NOTE — H&P (Signed)
     Wyline Mood, MD [address], [address], [place], [place], [address] [address], [place], [place], [address] Phone: [phone]  Fax: [phone]  Primary Care Physician:  Alfredia Ferguson, PA-C   Pre-Procedure History & Physical: HPI:  Mindy Yu is a 57 y.o. female is here for an colonoscopy.   Past Medical History:  Diagnosis Date   Hyperlipidemia    Hypertension     No past surgical history on file.  Prior to Admission medications   Medication Sig Start Date End Date Taking? Authorizing Provider  meloxicam (MOBIC) 15 MG tablet Take 15 mg by mouth daily.   Yes [provider]    Allergies as of 07/08/2022   (No Known Allergies)    Family History  Problem Relation Age of Onset   Cancer Mother        multiple myeloma   Hypertension Mother    Heart disease Brother    Cancer Maternal Grandmother    Breast cancer Paternal Grandmother        55's   Diabetes Paternal Grandmother    Cancer Paternal Grandmother    Heart disease Paternal Grandfather     Social History   Socioeconomic History   Marital status: Divorced    Spouse name: Not on file   Number of children: Not on file   Years of education: Not on file   Highest education level: Not on file  Occupational History   Not on file  Tobacco Use   Smoking status: Former    Current packs/day: 0.00    Types: Cigarettes    Quit date: 02/01/2009    Years since quitting: 13.6   Smokeless tobacco: Never  Vaping Use   Vaping status: Never Used  Substance and Sexual Activity   Alcohol use: Yes    Alcohol/week: 1.0 standard drink of alcohol    Types: 1 Glasses of wine per week    Comment: sparingly   Drug use: Never   Sexual activity: Not Currently  Other Topics Concern   Not on file  Social History Narrative   Not on file   Social Determinants of Health   Financial Resource Strain: Not on file  Food Insecurity: Not on file  Transportation Needs: Not on file  Physical  Activity: Not on file  Stress: Not on file  Social Connections: Not on file  Intimate Partner Violence: Not on file    Review of Systems: See HPI, otherwise negative ROS  Physical Exam: BP (!) 153/91   Pulse 70   Temp (!) 96.4 F (35.8 C) (Temporal)   Resp 20   Ht 5\' 5"  (1.651 m)   Wt 83.7 kg   SpO2 100%   BMI 30.72 kg/m  General:   Alert,  pleasant and cooperative in NAD Head:  Normocephalic and atraumatic. Neck:  Supple; no masses or thyromegaly. Lungs:  Clear throughout to auscultation, normal respiratory effort.    Heart:  +S1, +S2, Regular rate and rhythm, No edema. Abdomen:  Soft, nontender and nondistended. Normal bowel sounds, without guarding, and without rebound.   Neurologic:  Alert and  oriented x4;  grossly normal neurologically.  Impression/Plan: Mindy Yu is here for an colonoscopy to be performed for Screening colonoscopy average risk   Risks, benefits, limitations, and alternatives regarding  colonoscopy have been reviewed with the patient.  Questions have been answered.  All parties agreeable.   Wyline Mood, MD  09/08/2022, 8:26 AM

## 2022-09-08 NOTE — Anesthesia Postprocedure Evaluation (Signed)
Anesthesia Post Note  Patient: [name]  Procedure(s) Performed: COLONOSCOPY WITH PROPOFOL  Patient location during evaluation: Endoscopy Anesthesia Type: General Level of consciousness: awake and alert Pain management: pain level controlled Vital Signs Assessment: post-procedure vital signs reviewed and stable Respiratory status: spontaneous breathing, nonlabored ventilation, respiratory function stable and patient connected to nasal cannula oxygen Cardiovascular status: blood pressure returned to baseline and stable Postop Assessment: no apparent nausea or vomiting Anesthetic complications: no   No notable events documented.   Last Vitals:  Vitals:   09/08/22 0856 09/08/22 0909  BP: (!) 112/56 124/74  Pulse: (!) 58 (!) 55  Resp: 17 15  Temp:  (!) 35.5 C  SpO2: 100% 100%    Last Pain:  Vitals:   09/08/22 0909  TempSrc: Temporal  PainSc: 0-No pain                 Corinda Gubler

## 2022-09-08 NOTE — Transfer of Care (Signed)
Immediate Anesthesia Transfer of Care Note  Patient: SHIRLA SAILORS  Procedure(s) Performed: COLONOSCOPY WITH PROPOFOL  Patient Location: PACU  Anesthesia Type:MAC  Level of Consciousness: awake  Airway & Oxygen Therapy: Patient Spontanous Breathing  Post-op Assessment: Report given to RN and Post -op Vital signs reviewed and stable  Post vital signs: Reviewed and stable  Last Vitals:  Vitals Value Taken Time  BP 112/56 09/08/22 0856  Temp    Pulse 58 09/08/22 0856  Resp 17 09/08/22 0856  SpO2 100 % 09/08/22 0856    Last Pain:  Vitals:   09/08/22 0856  TempSrc:   PainSc: Asleep         Complications: No notable events documented.

## 2022-09-09 ENCOUNTER — Encounter: Payer: Self-pay | Admitting: Gastroenterology

## 2022-09-15 ENCOUNTER — Ambulatory Visit
Admission: RE | Admit: 2022-09-15 | Discharge: 2022-09-15 | Disposition: A | Discharge status: Home / Self Care | Payer: BC Managed Care – PPO | Source: Ambulatory Visit | Attending: Physician Assistant | Admitting: Physician Assistant

## 2022-09-15 DIAGNOSIS — Z1231 Encounter for screening mammogram for malignant neoplasm of breast: Secondary | ICD-10-CM | POA: Diagnosis not present

## 2023-07-14 ENCOUNTER — Encounter: Payer: Self-pay | Admitting: Family Medicine

## 2023-07-14 ENCOUNTER — Ambulatory Visit (INDEPENDENT_AMBULATORY_CARE_PROVIDER_SITE_OTHER): Admitting: Family Medicine

## 2023-07-14 VITALS — BP 169/81 | HR 66 | Ht 65.0 in | Wt 202.0 lb

## 2023-07-14 DIAGNOSIS — Z131 Encounter for screening for diabetes mellitus: Secondary | ICD-10-CM | POA: Diagnosis not present

## 2023-07-14 DIAGNOSIS — Z130 Encounter for screening for diseases of the blood and blood-forming organs and certain disorders involving the immune mechanism: Secondary | ICD-10-CM | POA: Diagnosis not present

## 2023-07-14 DIAGNOSIS — Z0000 Encounter for general adult medical examination without abnormal findings: Secondary | ICD-10-CM | POA: Diagnosis not present

## 2023-07-14 DIAGNOSIS — E7800 Pure hypercholesterolemia, unspecified: Secondary | ICD-10-CM

## 2023-07-14 DIAGNOSIS — Z114 Encounter for screening for human immunodeficiency virus [HIV]: Secondary | ICD-10-CM | POA: Diagnosis not present

## 2023-07-14 DIAGNOSIS — Z1159 Encounter for screening for other viral diseases: Secondary | ICD-10-CM | POA: Diagnosis not present

## 2023-07-14 DIAGNOSIS — R030 Elevated blood-pressure reading, without diagnosis of hypertension: Secondary | ICD-10-CM

## 2023-07-14 NOTE — Progress Notes (Signed)
 Complete physical exam   Patient: Mindy Yu   DOB: [birth year]   58 y.o. Female  MRN: [id number] Visit Date: 07/14/2023  Today's healthcare provider: Mimi Alt, MD   Chief Complaint  Patient presents with   Annual Exam    No concerns    Subjective    Mindy Yu is a 58 y.o. female who presents today for a complete physical exam.   She reports consuming a general diet.   Exercise is limited by orthopedic condition(s): knee pain on right right and arthritis, she used to exercise a lot when she was younger and is not interested in having her knees replaced yet .    She does not have additional problems to discuss today.   Discussed the use of AI scribe software for clinical note transcription with the patient, who gave verbal consent to proceed.  History of Present Illness     Past Medical History:  Diagnosis Date   Hyperlipidemia    Hypertension    Past Surgical History:  Procedure Laterality Date   COLONOSCOPY WITH PROPOFOL  N/A 09/08/2022   Procedure: COLONOSCOPY WITH PROPOFOL ;  Surgeon: Luke Salaam, MD;  Location: [facility] ENDOSCOPY;  Service: Gastroenterology;  Laterality: N/A;   Social History   Socioeconomic History   Marital status: Divorced    Spouse name: Not on file   Number of children: Not on file   Years of education: Not on file   Highest education level: Not on file  Occupational History   Not on file  Tobacco Use   Smoking status: Former    Current packs/day: 0.00    Types: Cigarettes    Quit date: 02/01/2009    Years since quitting: 14.4   Smokeless tobacco: Never  Vaping Use   Vaping status: Never Used  Substance and Sexual Activity   Alcohol use: Yes    Alcohol/week: 1.0 standard drink of alcohol    Types: 1 Glasses of wine per week    Comment: sparingly   Drug use: Never   Sexual activity: Not Currently  Other Topics Concern   Not on file  Social History Narrative   Not on file   Social Drivers of  Health   Financial Resource Strain: Low Risk  (07/14/2023)   Overall Financial Resource Strain (CARDIA)    Difficulty of Paying Living Expenses: Not hard at all  Food Insecurity: No Food Insecurity (07/14/2023)   Hunger Vital Sign    Worried About Running Out of Food in the Last Year: Never true    Ran Out of Food in the Last Year: Never true  Transportation Needs: No Transportation Needs (07/14/2023)   PRAPARE - Administrator, Civil Service (Medical): No    Lack of Transportation (Non-Medical): No  Physical Activity: Not on file  Stress: No Stress Concern Present (07/14/2023)   [employer] of Occupational Health - Occupational Stress Questionnaire    Feeling of Stress: Only a little  Social Connections: Not on file  Intimate Partner Violence: Not At Risk (07/14/2023)   Humiliation, Afraid, Rape, and Kick questionnaire    Fear of Current or Ex-Partner: No    Emotionally Abused: No    Physically Abused: No    Sexually Abused: No   Family Status  Relation Name Status   Mother  (Not Specified)   Brother  (Not Specified)   MGM  (Not Specified)   PGM  (Not Specified)   PGF  (Not Specified)  No partnership  data on file   Family History  Problem Relation Age of Onset   Cancer Mother        multiple myeloma   Hypertension Mother    Heart disease Brother    Cancer Maternal Grandmother    Breast cancer Paternal Grandmother        98's   Diabetes Paternal Grandmother    Cancer Paternal Grandmother    Heart disease Paternal Grandfather    No Known Allergies   Medications: Outpatient Medications Prior to Visit  Medication Sig   meloxicam (MOBIC) 15 MG tablet Take 15 mg by mouth daily.   No facility-administered medications prior to visit.    Review of Systems  Last CBC Lab Results  Component Value Date   WBC 4.1 07/05/2022   HGB 13.4 07/05/2022   HCT 41.1 07/05/2022   MCV 89 07/05/2022   MCH 28.9 07/05/2022   RDW 12.1 07/05/2022   PLT 357 07/05/2022    Last metabolic panel Lab Results  Component Value Date   GLUCOSE 93 07/05/2022   NA 138 07/05/2022   K 4.2 07/05/2022   CL 101 07/05/2022   CO2 21 07/05/2022   BUN 7 07/05/2022   CREATININE 0.92 07/05/2022   EGFR 72 07/05/2022   CALCIUM 10.0 07/05/2022   PHOS 4.3 04/20/2021   PROT 7.2 07/05/2022   ALBUMIN 4.4 07/05/2022   LABGLOB 2.8 07/05/2022   AGRATIO 1.6 07/05/2022   BILITOT 0.7 07/05/2022   ALKPHOS 76 07/05/2022   AST 22 07/05/2022   ALT 18 07/05/2022   Last lipids Lab Results  Component Value Date   CHOL 221 (H) 07/05/2022   HDL 90 07/05/2022   LDLCALC 121 (H) 07/05/2022   TRIG 57 07/05/2022   CHOLHDL 2.5 07/05/2022   Last hemoglobin A1c Lab Results  Component Value Date   HGBA1C 5.2 07/05/2022   Last thyroid  functions No results found for: TSH, T3TOTAL, T4TOTAL, THYROIDAB Last vitamin D No results found for: 25OHVITD2, 25OHVITD3, VD25OH     Objective    BP (!) 169/81   Pulse 66   Ht 5' 5 (1.651 m)   Wt 202 lb (91.6 kg)   SpO2 100%   BMI 33.61 kg/m  BP Readings from Last 3 Encounters:  07/14/23 (!) 169/81  09/08/22 124/74  08/06/22 122/70   Wt Readings from Last 3 Encounters:  07/14/23 202 lb (91.6 kg)  09/08/22 184 lb 9.6 oz (83.7 kg)  08/06/22 187 lb (84.8 kg)        Physical Exam Vitals reviewed.  Constitutional:      General: She is not in acute distress.    Appearance: Normal appearance. She is not ill-appearing, toxic-appearing or diaphoretic.  HENT:     Head: Normocephalic and atraumatic.     Right Ear: Tympanic membrane and external ear normal. There is no impacted cerumen.     Left Ear: Tympanic membrane and external ear normal. There is no impacted cerumen.     Nose: Nose normal.     Mouth/Throat:     Pharynx: Oropharynx is clear.   Eyes:     General: No scleral icterus.    Extraocular Movements: Extraocular movements intact.     Conjunctiva/sclera: Conjunctivae normal.     Pupils: Pupils are equal,  round, and reactive to light.    Cardiovascular:     Rate and Rhythm: Normal rate and regular rhythm.     Pulses: Normal pulses.     Heart sounds: Normal heart sounds. No murmur  heard.    No friction rub. No gallop.  Pulmonary:     Effort: Pulmonary effort is normal. No respiratory distress.     Breath sounds: Normal breath sounds. No wheezing, rhonchi or rales.  Abdominal:     General: Bowel sounds are normal. There is no distension.     Palpations: Abdomen is soft. There is no mass.     Tenderness: There is no abdominal tenderness. There is no guarding.   Musculoskeletal:        General: No deformity.     Cervical back: Normal range of motion and neck supple.     Right knee: Tenderness present.     Left knee: Tenderness present.     Right lower leg: No edema.     Left lower leg: No edema.  Lymphadenopathy:     Cervical: No cervical adenopathy.   Skin:    General: Skin is warm.     Capillary Refill: Capillary refill takes less than 2 seconds.     Findings: No erythema or rash.   Neurological:     General: No focal deficit present.     Mental Status: She is alert and oriented to person, place, and time.     Cranial Nerves: Cranial nerves 2-12 are intact. No cranial nerve deficit or facial asymmetry.     Motor: Motor function is intact. No weakness.     Gait: Gait normal.   Psychiatric:        Mood and Affect: Mood normal.        Behavior: Behavior normal.      Last depression screening scores    07/14/2023    2:41 PM 08/06/2022    8:10 AM 07/05/2022   10:37 AM  PHQ 2/9 Scores  PHQ - 2 Score 1 0 0  PHQ- 9 Score 5 1 0    Last fall risk screening    08/06/2022    8:10 AM  Fall Risk   Falls in the past year? 0  Number falls in past yr: 0  Injury with Fall? 0  Risk for fall due to : No Fall Risks  Follow up Falls prevention discussed    Last Audit-C alcohol use screening    07/14/2023    2:40 PM  Alcohol Use Disorder Test (AUDIT)  1. How often do you have a  drink containing alcohol? 2  2. How many drinks containing alcohol do you have on a typical day when you are drinking? 0  3. How often do you have six or more drinks on one occasion? 0  AUDIT-C Score 2   A score of 3 or more in women, and 4 or more in men indicates increased risk for alcohol abuse, EXCEPT if all of the points are from question 1   No results found for any visits on 07/14/23.  Assessment & Plan    Routine Health Maintenance and Physical Exam  Immunization History  Administered Date(s) Administered   Influenza-Unspecified 11/02/2018   Moderna Sars-Covid-2 Vaccination 03/14/2019, 04/13/2019, 01/11/2020   Tdap 04/20/2021    Health Maintenance  Topic Date Due   Hepatitis C Screening  Never done   Zoster Vaccines- Shingrix (1 of 2) Never done   COVID-19 Vaccine (6 - 2024-25 season) 10/03/2022   INFLUENZA VACCINE  09/02/2023   MAMMOGRAM  09/15/2023   Cervical Cancer Screening (HPV/Pap Cotest)  04/17/2025   DTaP/Tdap/Td (2 - Td or Tdap) 04/21/2031   Colonoscopy  09/07/2032   HPV VACCINES  Aged  Out   Meningococcal B Vaccine  Aged Out   HIV Screening  Discontinued    Problem List Items Addressed This Visit       Other   Pure hypercholesterolemia   Relevant Orders   Lipid panel   Elevated blood pressure reading   Relevant Orders   CMP14+EGFR   Annual physical exam - Primary   Other Visit Diagnoses       Encounter for hepatitis C screening test for low risk patient       Relevant Orders   Hepatitis C antibody     Screening for HIV (human immunodeficiency virus)       Relevant Orders   HIV Antibody (routine testing w rflx)     Screening for deficiency anemia       Relevant Orders   CBC     Screening for diabetes mellitus       Relevant Orders   Hemoglobin A1c        Assessment & Plan    Return in about 6 weeks (around 08/25/2023) for HTN.     Elevated Blood pressure  BP elevated at 169/81 and 178/80 She is not on antihypertenisves and  reports a history of white coat HTN  Recommended follow up with me in 6 weeks and monitoring BP and recording home measurements  Annual Physical  Chronic conditions are stable  Patient was counseled on benefits of regular physical activity with goal of 150 minutes of moderate to vigurous intensity 4 days per week  Patient was counseled to consume well balanced diet of fruits, vegetables, limited saturated fats and limited sugary foods and beverages with emphasis on consuming 6-8 glasses of water daily  Screening recommended today: A1c, lipids,CMP,CBC,Hep C,HIV  Colon cancer screening: 09/2022, due in 10 years   Cervical CA screening: last completed in 2022, +HR HPV  Mammogram: UTD, BIRADS 1, normal in 09/15/2022   Vaccines recommended today: Shingrix,COVID    Mimi Alt, MD  [facility] [phone] (phone) [phone] (fax)  [facility]

## 2023-07-14 NOTE — Patient Instructions (Signed)
 Recommended Vaccines  -Shingrix

## 2023-07-15 ENCOUNTER — Ambulatory Visit: Payer: Self-pay | Admitting: Family Medicine

## 2023-07-15 LAB — CMP14+EGFR
ALT: 19 IU/L (ref 0–32)
AST: 25 IU/L (ref 0–40)
Albumin: 4.6 g/dL (ref 3.8–4.9)
Alkaline Phosphatase: 90 IU/L (ref 44–121)
BUN/Creatinine Ratio: 11 (ref 9–23)
BUN: 10 mg/dL (ref 6–24)
Bilirubin Total: 0.5 mg/dL (ref 0.0–1.2)
CO2: 20 mmol/L (ref 20–29)
Calcium: 9.8 mg/dL (ref 8.7–10.2)
Chloride: 101 mmol/L (ref 96–106)
Creatinine, Ser: 0.89 mg/dL (ref 0.57–1.00)
Globulin, Total: 2.2 g/dL (ref 1.5–4.5)
Glucose: 82 mg/dL (ref 70–99)
Potassium: 4.2 mmol/L (ref 3.5–5.2)
Sodium: 138 mmol/L (ref 134–144)
Total Protein: 6.8 g/dL (ref 6.0–8.5)
eGFR: 75 mL/min/{1.73_m2} (ref >59)

## 2023-07-15 LAB — HEMOGLOBIN A1C
Est. average glucose Bld gHb Est-mCnc: 97 mg/dL
Hgb A1c MFr Bld: 5 % (ref 4.8–5.6)

## 2023-07-15 LAB — TSH+T4F+T3FREE
Free T4: 1.18 ng/dL (ref 0.82–1.77)
T3, Free: 2.5 pg/mL (ref 2.0–4.4)
TSH: 1.14 u[IU]/mL (ref 0.450–4.500)

## 2023-07-15 LAB — CBC
Hematocrit: 39.6 % (ref 34.0–46.6)
Hemoglobin: 13 g/dL (ref 11.1–15.9)
MCH: 30.5 pg (ref 26.6–33.0)
MCHC: 32.8 g/dL (ref 31.5–35.7)
MCV: 93 fL (ref 79–97)
Platelets: 359 10*3/uL (ref 150–450)
RBC: 4.26 x10E6/uL (ref 3.77–5.28)
RDW: 12.4 % (ref 11.7–15.4)
WBC: 6.4 10*3/uL (ref 3.4–10.8)

## 2023-07-15 LAB — HIV ANTIBODY (ROUTINE TESTING W REFLEX): HIV Screen 4th Generation wRfx: NONREACTIVE

## 2023-07-15 LAB — HEPATITIS C ANTIBODY: Hep C Virus Ab: NONREACTIVE

## 2023-07-15 LAB — LIPID PANEL
Chol/HDL Ratio: 2.7 ratio (ref 0.0–4.4)
Cholesterol, Total: 248 mg/dL — ABNORMAL HIGH (ref 100–199)
HDL: 92 mg/dL (ref >39)
LDL Chol Calc (NIH): 146 mg/dL — ABNORMAL HIGH (ref 0–99)
Triglycerides: 63 mg/dL (ref 0–149)
VLDL Cholesterol Cal: 10 mg/dL (ref 5–40)

## 2023-08-17 ENCOUNTER — Other Ambulatory Visit: Payer: Self-pay | Admitting: Family Medicine

## 2023-08-17 DIAGNOSIS — Z1231 Encounter for screening mammogram for malignant neoplasm of breast: Secondary | ICD-10-CM

## 2023-08-25 ENCOUNTER — Ambulatory Visit: Admitting: Family Medicine

## 2023-08-25 NOTE — Progress Notes (Deleted)
      Established patient visit   Patient: Mindy Yu   DOB: [birth year]   58 y.o. Female  MRN: [id number] Visit Date: 08/25/2023  Today's healthcare provider: Rockie Agent, MD   No chief complaint on file.  Subjective       Discussed the use of AI scribe software for clinical note transcription with the patient, who gave verbal consent to proceed.  History of Present Illness      Past Medical History:  Diagnosis Date   Hyperlipidemia    Hypertension     Medications: Outpatient Medications Prior to Visit  Medication Sig   meloxicam (MOBIC) 15 MG tablet Take 15 mg by mouth daily.   No facility-administered medications prior to visit.    Review of Systems  Last metabolic panel Lab Results  Component Value Date   GLUCOSE 82 07/14/2023   NA 138 07/14/2023   K 4.2 07/14/2023   CL 101 07/14/2023   CO2 20 07/14/2023   BUN 10 07/14/2023   CREATININE 0.89 07/14/2023   EGFR 75 07/14/2023   CALCIUM 9.8 07/14/2023   PHOS 4.3 04/20/2021   PROT 6.8 07/14/2023   ALBUMIN 4.6 07/14/2023   LABGLOB 2.2 07/14/2023   AGRATIO 1.6 07/05/2022   BILITOT 0.5 07/14/2023   ALKPHOS 90 07/14/2023   AST 25 07/14/2023   ALT 19 07/14/2023   Last lipids Lab Results  Component Value Date   CHOL 248 (H) 07/14/2023   HDL 92 07/14/2023   LDLCALC 146 (H) 07/14/2023   TRIG 63 07/14/2023   CHOLHDL 2.7 07/14/2023     {See past labs  Heme  Chem  Endocrine  Serology  Results Review (optional):1}   Objective    There were no vitals taken for this visit. BP Readings from Last 3 Encounters:  07/14/23 (!) 169/81  09/08/22 124/74  08/06/22 122/70   Wt Readings from Last 3 Encounters:  07/14/23 202 lb (91.6 kg)  09/08/22 184 lb 9.6 oz (83.7 kg)  08/06/22 187 lb (84.8 kg)    {See vitals history (optional):1}    Physical Exam  ***  No results found for any visits on 08/25/23.  Assessment & Plan     Problem List Items Addressed This Visit        Other   Pure hypercholesterolemia   Elevated blood pressure reading - Primary   Assessment & Plan      No follow-ups on file.         Rockie Agent, MD  [facility] [phone] (phone) [phone] (fax)  [facility]

## 2023-09-06 DIAGNOSIS — M48061 Spinal stenosis, lumbar region without neurogenic claudication: Secondary | ICD-10-CM | POA: Diagnosis not present

## 2023-09-06 DIAGNOSIS — M170 Bilateral primary osteoarthritis of knee: Secondary | ICD-10-CM | POA: Diagnosis not present

## 2023-09-16 ENCOUNTER — Ambulatory Visit
Admission: RE | Admit: 2023-09-16 | Discharge: 2023-09-16 | Disposition: A | Discharge status: Home / Self Care | Source: Ambulatory Visit | Attending: Family Medicine | Admitting: Family Medicine

## 2023-09-16 DIAGNOSIS — Z1231 Encounter for screening mammogram for malignant neoplasm of breast: Secondary | ICD-10-CM | POA: Diagnosis not present

## 2023-09-20 ENCOUNTER — Ambulatory Visit: Payer: Self-pay | Admitting: Family Medicine

## 2023-12-09 DIAGNOSIS — M170 Bilateral primary osteoarthritis of knee: Secondary | ICD-10-CM | POA: Diagnosis not present
